# Patient Record
Sex: Female | Born: 1955 | Race: Black or African American | Hispanic: No | Marital: Married | Smoking: Never smoker
Health system: Southern US, Community
[De-identification: ages and names within clinical notes are randomized; demographics above are authoritative.]

## PROBLEM LIST (undated history)

## (undated) DIAGNOSIS — IMO0001 Reserved for inherently not codable concepts without codable children: Secondary | ICD-10-CM

## (undated) DIAGNOSIS — F5105 Insomnia due to other mental disorder: Secondary | ICD-10-CM

## (undated) DIAGNOSIS — Z8659 Personal history of other mental and behavioral disorders: Secondary | ICD-10-CM

## (undated) DIAGNOSIS — K219 Gastro-esophageal reflux disease without esophagitis: Secondary | ICD-10-CM

## (undated) DIAGNOSIS — D869 Sarcoidosis, unspecified: Secondary | ICD-10-CM

## (undated) DIAGNOSIS — F419 Anxiety disorder, unspecified: Secondary | ICD-10-CM

## (undated) DIAGNOSIS — G4730 Sleep apnea, unspecified: Secondary | ICD-10-CM

## (undated) HISTORY — DX: Anxiety disorder, unspecified: F51.05

## (undated) HISTORY — DX: Anxiety disorder, unspecified: F41.9

---

## 2004-10-17 ENCOUNTER — Ambulatory Visit: Payer: Self-pay

## 2004-11-09 ENCOUNTER — Ambulatory Visit: Payer: Self-pay | Admitting: Unknown Physician Specialty

## 2005-10-16 ENCOUNTER — Ambulatory Visit: Payer: Self-pay

## 2006-11-10 ENCOUNTER — Emergency Department: Payer: Self-pay | Admitting: Emergency Medicine

## 2006-11-18 ENCOUNTER — Emergency Department: Payer: Self-pay | Admitting: Emergency Medicine

## 2007-03-11 ENCOUNTER — Ambulatory Visit: Payer: Self-pay

## 2007-03-30 ENCOUNTER — Other Ambulatory Visit: Payer: Self-pay

## 2007-03-30 ENCOUNTER — Emergency Department: Payer: Self-pay | Admitting: Emergency Medicine

## 2007-04-06 ENCOUNTER — Ambulatory Visit: Payer: Self-pay | Admitting: Internal Medicine

## 2008-04-27 ENCOUNTER — Ambulatory Visit: Payer: Self-pay

## 2009-06-12 ENCOUNTER — Ambulatory Visit: Payer: Self-pay | Admitting: Internal Medicine

## 2011-01-08 ENCOUNTER — Ambulatory Visit: Payer: Self-pay

## 2011-04-15 ENCOUNTER — Ambulatory Visit: Payer: Self-pay | Admitting: Internal Medicine

## 2012-01-22 ENCOUNTER — Ambulatory Visit: Payer: Self-pay | Admitting: Family Medicine

## 2012-07-10 ENCOUNTER — Emergency Department: Payer: Self-pay | Admitting: Emergency Medicine

## 2013-01-26 ENCOUNTER — Ambulatory Visit: Payer: Self-pay | Admitting: Internal Medicine

## 2014-02-15 ENCOUNTER — Ambulatory Visit: Payer: Self-pay | Admitting: Internal Medicine

## 2014-10-17 ENCOUNTER — Other Ambulatory Visit: Payer: Self-pay | Admitting: Internal Medicine

## 2014-10-17 ENCOUNTER — Ambulatory Visit
Admission: RE | Admit: 2014-10-17 | Discharge: 2014-10-17 | Disposition: A | Discharge status: Home / Self Care | Payer: BLUE CROSS/BLUE SHIELD | Source: Ambulatory Visit | Attending: Cardiology | Admitting: Cardiology

## 2014-10-17 ENCOUNTER — Ambulatory Visit
Admission: RE | Admit: 2014-10-17 | Discharge: 2014-10-17 | Disposition: A | Discharge status: Home / Self Care | Payer: BLUE CROSS/BLUE SHIELD | Source: Ambulatory Visit | Attending: Internal Medicine | Admitting: Internal Medicine

## 2014-10-17 DIAGNOSIS — R079 Chest pain, unspecified: Secondary | ICD-10-CM | POA: Diagnosis present

## 2014-10-17 DIAGNOSIS — R911 Solitary pulmonary nodule: Secondary | ICD-10-CM | POA: Insufficient documentation

## 2014-11-14 ENCOUNTER — Other Ambulatory Visit: Payer: Self-pay | Admitting: Internal Medicine

## 2014-11-14 DIAGNOSIS — R0789 Other chest pain: Secondary | ICD-10-CM

## 2014-11-14 DIAGNOSIS — R918 Other nonspecific abnormal finding of lung field: Secondary | ICD-10-CM

## 2014-11-18 ENCOUNTER — Ambulatory Visit
Admission: RE | Admit: 2014-11-18 | Discharge: 2014-11-18 | Disposition: A | Discharge status: Home / Self Care | Payer: BLUE CROSS/BLUE SHIELD | Source: Ambulatory Visit | Attending: Internal Medicine | Admitting: Internal Medicine

## 2014-11-18 DIAGNOSIS — R0789 Other chest pain: Secondary | ICD-10-CM

## 2014-11-18 DIAGNOSIS — R918 Other nonspecific abnormal finding of lung field: Secondary | ICD-10-CM | POA: Diagnosis present

## 2014-11-18 DIAGNOSIS — R590 Localized enlarged lymph nodes: Secondary | ICD-10-CM | POA: Insufficient documentation

## 2014-11-18 MED ORDER — IOHEXOL 300 MG/ML  SOLN
75.0000 mL | Freq: Once | INTRAMUSCULAR | Status: AC | PRN
  Administered 2014-11-18: 75 mL via INTRAVENOUS

## 2014-12-15 ENCOUNTER — Encounter
Admission: RE | Admit: 2014-12-15 | Discharge: 2014-12-15 | Disposition: A | Discharge status: Home / Self Care | Payer: BLUE CROSS/BLUE SHIELD | Source: Ambulatory Visit | Attending: Internal Medicine | Admitting: Internal Medicine

## 2014-12-15 DIAGNOSIS — D860 Sarcoidosis of lung: Secondary | ICD-10-CM | POA: Diagnosis not present

## 2014-12-15 DIAGNOSIS — Z01818 Encounter for other preprocedural examination: Secondary | ICD-10-CM | POA: Diagnosis present

## 2014-12-15 HISTORY — DX: Sleep apnea, unspecified: G47.30

## 2014-12-15 HISTORY — DX: Reserved for inherently not codable concepts without codable children: IMO0001

## 2014-12-15 HISTORY — DX: Anxiety disorder, unspecified: F41.9

## 2014-12-15 HISTORY — DX: Personal history of other mental and behavioral disorders: Z86.59

## 2014-12-15 HISTORY — DX: Gastro-esophageal reflux disease without esophagitis: K21.9

## 2014-12-15 LAB — CBC
HEMATOCRIT: 37.4 % (ref 35.0–47.0)
Hemoglobin: 12.4 g/dL (ref 12.0–16.0)
MCH: 28.7 pg (ref 26.0–34.0)
MCHC: 33.3 g/dL (ref 32.0–36.0)
MCV: 86.4 fL (ref 80.0–100.0)
Platelets: 189 10*3/uL (ref 150–440)
RBC: 4.32 MIL/uL (ref 3.80–5.20)
RDW: 13.4 % (ref 11.5–14.5)
WBC: 5.2 10*3/uL (ref 3.6–11.0)

## 2014-12-15 LAB — DIFFERENTIAL
BASOS PCT: 1 %
Basophils Absolute: 0 10*3/uL (ref 0–0.1)
EOS ABS: 0.2 10*3/uL (ref 0–0.7)
Eosinophils Relative: 3 %
Lymphocytes Relative: 38 %
Lymphs Abs: 2 10*3/uL (ref 1.0–3.6)
MONO ABS: 0.7 10*3/uL (ref 0.2–0.9)
MONOS PCT: 14 %
Neutro Abs: 2.3 10*3/uL (ref 1.4–6.5)
Neutrophils Relative %: 44 %

## 2014-12-15 LAB — BASIC METABOLIC PANEL
Anion gap: 7 (ref 5–15)
BUN: 21 mg/dL — AB (ref 6–20)
CALCIUM: 9.1 mg/dL (ref 8.9–10.3)
CO2: 28 mmol/L (ref 22–32)
Chloride: 103 mmol/L (ref 101–111)
Creatinine, Ser: 0.93 mg/dL (ref 0.44–1.00)
GFR calc Af Amer: >60 mL/min (ref >60)
GLUCOSE: 98 mg/dL (ref 65–99)
Potassium: 3.8 mmol/L (ref 3.5–5.1)
Sodium: 138 mmol/L (ref 135–145)

## 2014-12-15 NOTE — Patient Instructions (Signed)
  Your procedure is scheduled on: December 21, 2014(Wednesday) Report to Day Surgery.[facility]) To find out your arrival time please call [phone] between 1PM - 3PM on December 20, 2014(Tuesday).  Remember: Instructions that are not followed completely may result in serious medical risk, up to and including death, or upon the discretion of your surgeon and anesthesiologist your surgery may need to be rescheduled.    __x__ 1. Do not eat food or drink liquids after midnight. No gum chewing or hard candies.     ____ 2. No Alcohol for 24 hours before or after surgery.   ____ 3. Bring all medications with you on the day of surgery if instructed.    __x__ 4. Notify your doctor if there is any change in your medical condition     (cold, fever, infections).     Do not wear jewelry, make-up, hairpins, clips or nail polish.  Do not wear lotions, powders, or perfumes. You may wear deodorant.  Do not shave 48 hours prior to surgery. Men may shave face and neck.  Do not bring valuables to the hospital.    [facility] is not responsible for any belongings or valuables.               Contacts, dentures or bridgework may not be worn into surgery.  Leave your suitcase in the car. After surgery it may be brought to your room.  For patients admitted to the hospital, discharge time is determined by your                treatment team.   Patients discharged the day of surgery will not be allowed to drive home.   Please read over the following fact sheets that you were given:      ____ Take these medicines the morning of surgery with A SIP OF WATER:    1.   2.   3.   4.  5.  6.  ____ Fleet Enema (as directed)   ____ Use CHG Soap as directed  __x__ Use inhalers on the day of surgery(Use Albuterol inhaler the day of surgery and bring inhaler with you to hospital)  ____ Stop metformin 2 days prior to surgery    ____ Take 1/2 of usual insulin dose the night before surgery and none on the  morning of surgery.   __x__ Stop Coumadin/Plavix/aspirin on (Patient stopped Aspirin one week ago)  ____ Stop Anti-inflammatories on    ____ Stop supplements until after surgery.    __x__ Bring C-Pap to the hospital.

## 2014-12-21 ENCOUNTER — Ambulatory Visit
Admission: RE | Admit: 2014-12-21 | Payer: BLUE CROSS/BLUE SHIELD | Source: Ambulatory Visit | Admitting: Internal Medicine

## 2014-12-21 ENCOUNTER — Encounter: Admission: RE | Payer: Self-pay | Source: Ambulatory Visit

## 2014-12-21 ENCOUNTER — Ambulatory Visit
Admission: RE | Admit: 2014-12-21 | Discharge: 2014-12-21 | Disposition: A | Discharge status: Home / Self Care | Payer: BLUE CROSS/BLUE SHIELD | Source: Ambulatory Visit | Attending: Internal Medicine | Admitting: Internal Medicine

## 2014-12-21 ENCOUNTER — Encounter: Payer: Self-pay | Admitting: *Deleted

## 2014-12-21 ENCOUNTER — Encounter
Admission: RE | Disposition: A | Discharge status: Home / Self Care | Payer: Self-pay | Source: Ambulatory Visit | Attending: Internal Medicine

## 2014-12-21 ENCOUNTER — Ambulatory Visit: Payer: BLUE CROSS/BLUE SHIELD | Admitting: Certified Registered Nurse Anesthetist

## 2014-12-21 DIAGNOSIS — Z7951 Long term (current) use of inhaled steroids: Secondary | ICD-10-CM | POA: Diagnosis not present

## 2014-12-21 DIAGNOSIS — R0602 Shortness of breath: Secondary | ICD-10-CM | POA: Insufficient documentation

## 2014-12-21 DIAGNOSIS — R079 Chest pain, unspecified: Secondary | ICD-10-CM | POA: Diagnosis present

## 2014-12-21 DIAGNOSIS — E669 Obesity, unspecified: Secondary | ICD-10-CM | POA: Insufficient documentation

## 2014-12-21 DIAGNOSIS — R590 Localized enlarged lymph nodes: Secondary | ICD-10-CM | POA: Diagnosis not present

## 2014-12-21 DIAGNOSIS — G4730 Sleep apnea, unspecified: Secondary | ICD-10-CM | POA: Diagnosis not present

## 2014-12-21 DIAGNOSIS — R942 Abnormal results of pulmonary function studies: Secondary | ICD-10-CM | POA: Diagnosis present

## 2014-12-21 DIAGNOSIS — Z6836 Body mass index (BMI) 36.0-36.9, adult: Secondary | ICD-10-CM | POA: Insufficient documentation

## 2014-12-21 DIAGNOSIS — R599 Enlarged lymph nodes, unspecified: Secondary | ICD-10-CM

## 2014-12-21 DIAGNOSIS — K219 Gastro-esophageal reflux disease without esophagitis: Secondary | ICD-10-CM | POA: Diagnosis not present

## 2014-12-21 DIAGNOSIS — Z79899 Other long term (current) drug therapy: Secondary | ICD-10-CM | POA: Diagnosis not present

## 2014-12-21 HISTORY — PX: ENDOBRONCHIAL ULTRASOUND: SHX5096

## 2014-12-21 SURGERY — ENDOBRONCHIAL ULTRASOUND (EBUS)
Anesthesia: Choice

## 2014-12-21 SURGERY — ENDOBRONCHIAL ULTRASOUND (EBUS)
Anesthesia: General

## 2014-12-21 MED ORDER — PROPOFOL 10 MG/ML IV BOLUS
INTRAVENOUS | Status: DC | PRN
  Administered 2014-12-21: 30 mg via INTRAVENOUS
  Administered 2014-12-21: 200 mg via INTRAVENOUS

## 2014-12-21 MED ORDER — MIDAZOLAM HCL 2 MG/2ML IJ SOLN
INTRAMUSCULAR | Status: DC | PRN
Start: 2014-12-21 — End: 2014-12-21
  Administered 2014-12-21: 2 mg via INTRAVENOUS

## 2014-12-21 MED ORDER — LACTATED RINGERS IV SOLN
INTRAVENOUS | Status: DC
  Administered 2014-12-21 (×2): via INTRAVENOUS

## 2014-12-21 MED ORDER — ONDANSETRON HCL 4 MG/2ML IJ SOLN
INTRAMUSCULAR | Status: DC | PRN
  Administered 2014-12-21: 4 mg via INTRAVENOUS

## 2014-12-21 MED ORDER — DEXAMETHASONE SODIUM PHOSPHATE 4 MG/ML IJ SOLN
INTRAMUSCULAR | Status: DC | PRN
  Administered 2014-12-21: 10 mg via INTRAVENOUS

## 2014-12-21 MED ORDER — ROCURONIUM BROMIDE 100 MG/10ML IV SOLN
INTRAVENOUS | Status: DC | PRN
  Administered 2014-12-21: 5 mg via INTRAVENOUS

## 2014-12-21 MED ORDER — SUCCINYLCHOLINE CHLORIDE 20 MG/ML IJ SOLN
INTRAMUSCULAR | Status: DC | PRN
  Administered 2014-12-21: 140 mg via INTRAVENOUS

## 2014-12-21 MED ORDER — FENTANYL CITRATE (PF) 100 MCG/2ML IJ SOLN: 25.0000 ug | INTRAMUSCULAR | Status: DC | PRN

## 2014-12-21 MED ORDER — FENTANYL CITRATE (PF) 100 MCG/2ML IJ SOLN
INTRAMUSCULAR | Status: DC | PRN
  Administered 2014-12-21: 100 ug via INTRAVENOUS

## 2014-12-21 MED ORDER — LIDOCAINE HCL 2 % EX GEL: 1.0000 "application " | Freq: Once | CUTANEOUS | Status: DC

## 2014-12-21 MED ORDER — LIDOCAINE HCL (CARDIAC) 20 MG/ML IV SOLN
INTRAVENOUS | Status: DC | PRN
Start: 2014-12-21 — End: 2014-12-21
  Administered 2014-12-21: 100 mg via INTRAVENOUS

## 2014-12-21 MED ORDER — ONDANSETRON HCL 4 MG/2ML IJ SOLN: 4.0000 mg | Freq: Once | INTRAMUSCULAR | Status: DC | PRN

## 2014-12-21 NOTE — Anesthesia Preprocedure Evaluation (Addendum)
Anesthesia Evaluation  Patient identified by MRN, date of birth, ID band Patient awake    Reviewed: Allergy & Precautions, NPO status , Patient's Chart, lab work & pertinent test results  Airway Mallampati: II  TM Distance: >3 FB Neck ROM: Full    Dental  (+) Teeth Intact   Pulmonary sleep apnea and Continuous Positive Airway Pressure Ventilation ,    Pulmonary exam normal        Cardiovascular Exercise Tolerance: Good negative cardio ROS Normal cardiovascular exam     Neuro/Psych    GI/Hepatic GERD  Medicated and Controlled,  Endo/Other    Renal/GU      Musculoskeletal   Abdominal (+) + obese,   Peds  Hematology   Anesthesia Other Findings   Reproductive/Obstetrics                            Anesthesia Physical Anesthesia Plan  ASA: III  Anesthesia Plan: General   Post-op Pain Management:    Induction: Intravenous  Airway Management Planned: Oral ETT  Additional Equipment:   Intra-op Plan:   Post-operative Plan: Extubation in OR  Informed Consent: I have reviewed the patients History and Physical, chart, labs and discussed the procedure including the risks, benefits and alternatives for the proposed anesthesia with the patient or authorized representative who has indicated his/her understanding and acceptance.     Plan Discussed with: CRNA  Anesthesia Plan Comments:         Anesthesia Quick Evaluation

## 2014-12-21 NOTE — Transfer of Care (Signed)
Immediate Anesthesia Transfer of Care Note  Patient: Valerie Yu  Procedure(s) Performed: Procedure(s): ENDOBRONCHIAL ULTRASOUND (N/A)  Patient Location: PACU  Anesthesia Type:General  Level of Consciousness: awake and alert   Airway & Oxygen Therapy: Patient Spontanous Breathing and Patient connected to face mask oxygen  Post-op Assessment: Report given to RN  Post vital signs: Reviewed and stable  Last Vitals:  Filed Vitals:   12/21/14 1208  BP: 139/74  Pulse: 81  Temp: 36.7 C  Resp: 18    Complications: No apparent anesthesia complications

## 2014-12-21 NOTE — Anesthesia Postprocedure Evaluation (Signed)
  Anesthesia Post-op Note  Patient: Valerie Yu  Procedure(s) Performed: Procedure(s): ENDOBRONCHIAL ULTRASOUND (N/A)  Anesthesia type:General  Patient location: PACU  Post pain: Pain level controlled  Post assessment: Post-op Vital signs reviewed, Patient's Cardiovascular Status Stable, Respiratory Function Stable, Patent Airway and No signs of Nausea or vomiting  Post vital signs: Reviewed and stable  Last Vitals:  Filed Vitals:   12/21/14 1208  BP: 139/74  Pulse: 81  Temp: 36.7 C  Resp: 18    Level of consciousness: awake, alert  and patient cooperative  Complications: No apparent anesthesia complications

## 2014-12-21 NOTE — Op Note (Signed)
[facility] Patient Name: Valerie DegreeJerome Messimer Procedure Date: 12/21/2014 11:26 AM MRN: [id number] Account #: [id number] Date of Birth: 07/17/1955 Admit Type: Outpatient Age: [age] Room: Bronch Suite on 2nd floor Gender: Female Note Status: Finalized Attending MD: Santiago Gladavid Horice Carrero, MD Procedure:         Bronchoscopy/EBUS Indications:       Mediastinal adenopathy, Paratracheal adenopathy Providers:         Santiago Gladavid Pearson Picou, MD, [place] Chardammy Bailey, Admin. Radiographer, therapeutic(Technician) Referring MD:       Medicines:         Monitored Anesthesia Care Complications:     No immediate complications Procedure:         Pre-Anesthesia Assessment:                    - The risks and benefits of the procedure and the sedation                     options and risks were discussed with the patient. All                     questions were answered and informed consent was obtained.                    - A History and Physical has been performed. The patient's                     medications, allergies and sensitivities have been                     reviewed.                    After obtaining informed consent, the bronchoscope was                     passed under direct vision. Throughout the procedure, the                     patient's blood pressure, pulse, and oxygen saturations                     were monitored continuously. the Bronchoscope was                     introduced through the mouth, via the endotracheal tube                     and advanced to the trachea. The procedure was                     accomplished without difficulty. The patient tolerated the                     procedure well. Findings:      Lymph Nodes: Transbronchial biopsies were performed of the level 4R       (lower paratracheal) lymph node(s) of the lung using a 21 gauge Wang       needle and sent for routine cytology. The procedure was guided by       ultrasound. Six biopsy passes were performed. Six biopsy samples were        obtained. Impression:        - Mediastinal adenopathy                    -  Paratracheal adenopathy                    - Transbronchial lung biopsies were performed. Recommendation:    - Await biopsy results. Santiago Glad MD Santiago Glad, MD 12/21/2014 11:56:04 AM This report has been signed electronically. Number of Addenda: 0 Note Initiated On: 12/21/2014 11:26 AM      [facility]

## 2014-12-21 NOTE — Anesthesia Procedure Notes (Signed)
Procedure Name: Intubation Date/Time: 12/21/2014 11:17 AM Performed by: Ginger CarneMICHELET, Dallis Darden Pre-anesthesia Checklist: Patient identified, Emergency Drugs available, Suction available, Patient being monitored and Timeout performed Patient Re-evaluated:Patient Re-evaluated prior to inductionOxygen Delivery Method: Circle system utilized Preoxygenation: Pre-oxygenation with 100% oxygen Intubation Type: IV induction Ventilation: Mask ventilation without difficulty Laryngoscope Size: Miller and 2 Grade View: Grade II Tube type: Oral Tube size: 8.5 mm Number of attempts: 1 Placement Confirmation: ETT inserted through vocal cords under direct vision,  breath sounds checked- equal and bilateral,  positive ETCO2 and CO2 detector Secured at: 22 cm Tube secured with: Tape Dental Injury: Teeth and Oropharynx as per pre-operative assessment

## 2014-12-21 NOTE — Discharge Instructions (Signed)
Flexible Bronchoscopy, Care After °Refer to this sheet in the next few weeks. These instructions provide you with information on caring for yourself after your procedure. Your health care provider may also give you more specific instructions. Your treatment has been planned according to current medical practices, but problems sometimes occur. Call your health care provider if you have any problems or questions after your procedure.  °WHAT TO EXPECT AFTER THE PROCEDURE °It is normal to have the following symptoms for 24-48 hours after the procedure:  °· Increased cough. °· Low-grade fever. °· Sore throat or hoarse voice. °· Small streaks of blood in your thick spit (sputum) if tissue samples were taken (biopsy). °HOME CARE INSTRUCTIONS  °· Do not eat or drink anything for 2 hours after your procedure. Your nose and throat were numbed by medicine. If you try to eat or drink before the medicine wears off, food or drink could go into your lungs or you could burn yourself. After the numbness is gone and your cough and gag reflexes have returned, you may eat soft food and drink liquids slowly.   °· The day after the procedure, you can go back to your normal diet.   °· You may resume normal activities.   °· Keep all follow-up visits as directed by your health care provider. It is important to keep all your appointments, especially if tissue samples were taken for testing (biopsy). °SEEK IMMEDIATE MEDICAL CARE IF:  °· You have increasing shortness of breath.   °· You become light-headed or faint.   °· You have chest pain.   °· You have any new concerning symptoms. °· You cough up more than a small amount of blood. °· The amount of blood you cough up increases. °MAKE SURE YOU: °· Understand these instructions. °· Will watch your condition. °· Will get help right away if you are not doing well or get worse. °  °This information is not intended to replace advice given to you by your health care provider. Make sure you discuss  any questions you have with your health care provider. °  °Document Released: 08/31/2004 Document Revised: 03/04/2014 Document Reviewed: 10/16/2012 °Elsevier Interactive Patient Education ©2016 [employer] ° °

## 2014-12-21 NOTE — H&P (Signed)
.   [facility]     Date: 12/21/2014,   MRN# [id number] Azzie AlmasJerome R Timko 06/07/1955 Code Status:  Hosp day:@LENGTHOFSTAYDAYS @ Referring MD: @ATDPROV @     PCP:      AdmissionWeight: 218 lb (98.884 kg)                 CurrentWeight: 218 lb (98.884 kg) Azzie AlmasJerome R Houser is a 59 y.o. old female seen in consultation for abnormal Ct chest  CC SOB HPI 59 yo AAF seen today for EBUS Patient with abnormal CT chest c/w mediastinal adenopathy Had some chest pain and SOB and DOE for several months Had extensive cardiac work up.         MEDICATIONS    Home Medication:  No current outpatient prescriptions on file.  Current Medication:   Current facility-administered medications:  .  lactated ringers infusion, , Intravenous, Continuous, Rosaria FerriesJoseph K Piscitello, MD, Last Rate: 75 mL/hr at 12/21/14 1049 .  lidocaine (XYLOCAINE) 2 % jelly 1 application, 1 application, Topical, Once, Erin FullingKurian Farouk Vivero, MD     ALLERGIES   Citalopram     REVIEW OF SYSTEMS   Review of Systems  Constitutional: Negative for fever, chills and weight loss.  HENT: Negative.   Eyes: Negative.   Respiratory: Positive for shortness of breath. Negative for wheezing.   Cardiovascular: Positive for chest pain.  Gastrointestinal: Negative for nausea, vomiting and abdominal pain.  Genitourinary: Negative.   Musculoskeletal: Positive for back pain.  Skin: Negative for rash.  Neurological: Negative for dizziness, tingling and tremors.  Endo/Heme/Allergies: Negative.   Psychiatric/Behavioral: Negative.   All other systems reviewed and are negative.    VS: BP 153/89 mmHg  Pulse 85  Temp(Src) 96.7 F (35.9 C) (Tympanic)  Resp 16  Ht 5\' 5"  (1.651 m)  Wt 218 lb (98.884 kg)  BMI 36.28 kg/m2  SpO2 100%     PHYSICAL EXAM   Physical Exam  Constitutional: She is oriented to person, place, and time. She appears well-developed and well-nourished. No distress.  HENT:  Head:  Normocephalic and atraumatic.  Mouth/Throat: No oropharyngeal exudate.  Eyes: EOM are normal. Pupils are equal, round, and reactive to light.  Neck: Normal range of motion. Neck supple.  Cardiovascular: Normal rate, regular rhythm and normal heart sounds.   No murmur heard. Pulmonary/Chest: No respiratory distress. She has no wheezes.  Abdominal: Soft. Bowel sounds are normal.  Musculoskeletal: Normal range of motion. She exhibits no edema.  Neurological: She is alert and oriented to person, place, and time.  Skin: Skin is warm. She is not diaphoretic.  Psychiatric: She has a normal mood and affect.       IMAGING    CT chest with Mediastinal Adenopathy    ASSESSMENT/PLAN   Sarcoid vs Lymphoma  Plan for EBUS today   The Risks and Benefits of the Bronchoscopy with EBUS were explained to patient/family and I have discussed the risk for acute bleeding, increased chance of infection, increased chance of respiratory failure and cardiac arrest and death. The patient/family understand the risks and benefits and have agreed to proceed with procedure.    Patient/Family are satisfied with Plan of action and management. All questions answered   Lucie LeatherKurian David Tashi Band, M.D.  [facility]  Medical Director [facility] Medical Director [facility] Card[facility]

## 2014-12-21 NOTE — Op Note (Signed)
PROCEDURE: ENDOBRONCHIAL ULTRASOUND   PROCEDURE DATE: 12/21/2014  TIME:  NAME:  Valerie Yu  DOB:09/06/1955  MRN: [id number] LOC:  ARPO/None    HOSP DAY: @LENGTHOFSTAYDAYS @ CODE STATUS:        Indications/Preliminary Diagnosis:   Consent: (Place X beside choice/s below)  The benefits, risks and possible complications of the procedure were        explained to:  _x__ patient  _x__ patient's family  ___ other:___________  who verbalized understanding and gave:  ___ verbal  ___ written  __x_ verbal and written  ___ telephone  ___ other:________ consent.      Unable to obtain consent; procedure performed on emergent basis.     Other:       PRESEDATION ASSESSMENT: History and Physical has been performed. Patient meds and allergies have been reviewed. Presedation airway examination has been performed and documented. Baseline vital signs, sedation score, oxygenation status, and cardiac rhythm were reviewed. Patient was deemed to be in satisfactory condition to undergo the procedure.    PREMEDICATIONS: SEE ANESTHESIOLOGY RECORDS   Airway Prep (Place X beside choice below)   1% Transtracheal Lidocaine Anesthetization 7 cc   Patient prepped per Bronchoscopy Lab Policy       Insertion Route (Place X beside choice below)   Nasal   Oral   Endotracheal Tube   Tracheostomy   INTRAPROCEDURE MEDICATIONS: SEE ANESTHESIOLOGY RECORDS   PROCEDURE DETAILS: Timeout performed and correct patient, name, & ID confirmed. Following prep per Pulmonary policy, appropriate sedation was administered.  Airway exam proceeded with findings, technical procedures, and specimen collection as noted below. At the end of exam the scope was withdrawn without incident. Impression and Plan as noted below.   EBUS scope was transversed to Paratracheal space, I was able to visualize enlarged LN's approx 3x2 Cm in lower paratracheal space    TECHNICAL PROCEDURES: (Place X beside choice below)   Procedures   Description    None     Electrocautery     Cryotherapy     Balloon Dilatation     Bronchography     Stent Placement     Therapeutic Aspiration     Laser/Argon Plasma    Brachytherapy Catheter Placement    Foreign Body Removal     SPECIMENS (Sites): (Place X beside choice below)  Specimens Description   No Specimens Obtained     Washings    Lavage   x Biopsies FNA 21 Gauge x 6   Fine Needle Aspirates    Brushings    Sputum    FINDINGS: adenopathy ESTIMATED BLOOD LOSS: none COMPLICATIONS/RESOLUTION: none       IMPRESSION:POST-PROCEDURE DX: likely sarcoid -follow up pathology reports    RECOMMENDATION/PLAN: follow up pathology     Lucie LeatherKurian David Nashea Chumney, M.D.  [facility]  Medical Director [facility] Medical Director [facility] Card[facility]

## 2014-12-24 LAB — CYTOLOGY - NON PAP

## 2015-01-10 ENCOUNTER — Other Ambulatory Visit: Payer: Self-pay | Admitting: Family Medicine

## 2015-01-10 DIAGNOSIS — Z1231 Encounter for screening mammogram for malignant neoplasm of breast: Secondary | ICD-10-CM

## 2015-02-23 ENCOUNTER — Ambulatory Visit
Admission: RE | Admit: 2015-02-23 | Discharge: 2015-02-23 | Disposition: A | Discharge status: Home / Self Care | Payer: BLUE CROSS/BLUE SHIELD | Source: Ambulatory Visit | Attending: Family Medicine | Admitting: Family Medicine

## 2015-02-23 DIAGNOSIS — Z1231 Encounter for screening mammogram for malignant neoplasm of breast: Secondary | ICD-10-CM | POA: Insufficient documentation

## 2015-03-10 ENCOUNTER — Ambulatory Visit
Admission: RE | Admit: 2015-03-10 | Discharge: 2015-03-10 | Disposition: A | Discharge status: Home / Self Care | Payer: BLUE CROSS/BLUE SHIELD | Source: Ambulatory Visit | Attending: Internal Medicine | Admitting: Internal Medicine

## 2015-03-10 ENCOUNTER — Other Ambulatory Visit: Payer: Self-pay | Admitting: Internal Medicine

## 2015-03-10 DIAGNOSIS — R059 Cough, unspecified: Secondary | ICD-10-CM

## 2015-03-10 DIAGNOSIS — R05 Cough: Secondary | ICD-10-CM | POA: Diagnosis present

## 2015-03-10 DIAGNOSIS — D869 Sarcoidosis, unspecified: Secondary | ICD-10-CM | POA: Diagnosis not present

## 2015-03-10 DIAGNOSIS — R0781 Pleurodynia: Secondary | ICD-10-CM | POA: Diagnosis present

## 2015-03-10 DIAGNOSIS — R079 Chest pain, unspecified: Secondary | ICD-10-CM

## 2015-04-21 ENCOUNTER — Other Ambulatory Visit: Payer: Self-pay | Admitting: Internal Medicine

## 2015-04-21 DIAGNOSIS — R079 Chest pain, unspecified: Secondary | ICD-10-CM

## 2015-04-26 ENCOUNTER — Ambulatory Visit
Admission: RE | Admit: 2015-04-26 | Discharge: 2015-04-26 | Disposition: A | Discharge status: Home / Self Care | Payer: BLUE CROSS/BLUE SHIELD | Source: Ambulatory Visit | Attending: Internal Medicine | Admitting: Internal Medicine

## 2015-04-26 DIAGNOSIS — D869 Sarcoidosis, unspecified: Secondary | ICD-10-CM | POA: Diagnosis not present

## 2015-04-26 DIAGNOSIS — R918 Other nonspecific abnormal finding of lung field: Secondary | ICD-10-CM | POA: Diagnosis not present

## 2015-04-26 DIAGNOSIS — R079 Chest pain, unspecified: Secondary | ICD-10-CM | POA: Diagnosis present

## 2015-04-26 MED ORDER — IOHEXOL 350 MG/ML SOLN
75.0000 mL | Freq: Once | INTRAVENOUS | Status: AC | PRN
  Administered 2015-04-26: 75 mL via INTRAVENOUS

## 2015-07-12 ENCOUNTER — Emergency Department
Admission: EM | Admit: 2015-07-12 | Discharge: 2015-07-12 | Disposition: A | Discharge status: Home / Self Care | Payer: BLUE CROSS/BLUE SHIELD | Attending: Emergency Medicine | Admitting: Emergency Medicine

## 2015-07-12 ENCOUNTER — Emergency Department: Payer: BLUE CROSS/BLUE SHIELD

## 2015-07-12 ENCOUNTER — Encounter: Payer: Self-pay | Admitting: Emergency Medicine

## 2015-07-12 DIAGNOSIS — R079 Chest pain, unspecified: Secondary | ICD-10-CM

## 2015-07-12 DIAGNOSIS — Z79899 Other long term (current) drug therapy: Secondary | ICD-10-CM | POA: Insufficient documentation

## 2015-07-12 DIAGNOSIS — Z7982 Long term (current) use of aspirin: Secondary | ICD-10-CM | POA: Insufficient documentation

## 2015-07-12 DIAGNOSIS — D860 Sarcoidosis of lung: Secondary | ICD-10-CM | POA: Diagnosis not present

## 2015-07-12 DIAGNOSIS — R0789 Other chest pain: Secondary | ICD-10-CM | POA: Diagnosis not present

## 2015-07-12 DIAGNOSIS — D869 Sarcoidosis, unspecified: Secondary | ICD-10-CM | POA: Insufficient documentation

## 2015-07-12 HISTORY — DX: Sarcoidosis, unspecified: D86.9

## 2015-07-12 LAB — COMPREHENSIVE METABOLIC PANEL
ALK PHOS: 41 U/L (ref 38–126)
ALT: 22 U/L (ref 14–54)
AST: 32 U/L (ref 15–41)
Albumin: 3.5 g/dL (ref 3.5–5.0)
Anion gap: 7 (ref 5–15)
BUN: 19 mg/dL (ref 6–20)
CALCIUM: 9 mg/dL (ref 8.9–10.3)
CO2: 26 mmol/L (ref 22–32)
CREATININE: 1.06 mg/dL — AB (ref 0.44–1.00)
Chloride: 106 mmol/L (ref 101–111)
GFR calc non Af Amer: 57 mL/min — ABNORMAL LOW (ref >60)
GLUCOSE: 150 mg/dL — AB (ref 65–99)
Potassium: 3.8 mmol/L (ref 3.5–5.1)
SODIUM: 139 mmol/L (ref 135–145)
Total Bilirubin: 0.5 mg/dL (ref 0.3–1.2)
Total Protein: 6.8 g/dL (ref 6.5–8.1)

## 2015-07-12 LAB — CBC WITH DIFFERENTIAL/PLATELET
Basophils Absolute: 0 10*3/uL (ref 0–0.1)
Basophils Relative: 0 %
EOS ABS: 0.1 10*3/uL (ref 0–0.7)
Eosinophils Relative: 3 %
HCT: 36.4 % (ref 35.0–47.0)
HEMOGLOBIN: 12.4 g/dL (ref 12.0–16.0)
LYMPHS ABS: 2.1 10*3/uL (ref 1.0–3.6)
LYMPHS PCT: 38 %
MCH: 29.9 pg (ref 26.0–34.0)
MCHC: 34.2 g/dL (ref 32.0–36.0)
MCV: 87.3 fL (ref 80.0–100.0)
Monocytes Absolute: 0.5 10*3/uL (ref 0.2–0.9)
Monocytes Relative: 10 %
NEUTROS ABS: 2.8 10*3/uL (ref 1.4–6.5)
NEUTROS PCT: 49 %
Platelets: 193 10*3/uL (ref 150–440)
RBC: 4.17 MIL/uL (ref 3.80–5.20)
RDW: 13.2 % (ref 11.5–14.5)
WBC: 5.7 10*3/uL (ref 3.6–11.0)

## 2015-07-12 LAB — TROPONIN I
Troponin I: <0.03 ng/mL
Troponin I: <0.03 ng/mL (ref <0.031)

## 2015-07-12 MED ORDER — KETOROLAC TROMETHAMINE 30 MG/ML IJ SOLN
15.0000 mg | Freq: Once | INTRAMUSCULAR | Status: AC
  Administered 2015-07-12: 15 mg via INTRAVENOUS
  Filled 2015-07-12: qty 1

## 2015-07-12 NOTE — ED Notes (Signed)
Pt in via EMS with complaints of intermittent chest pain radiating to left arm and back that wakes her up every morning x 1 week.  Pt reports shortness of breath, nausea, weakness during episodes of chest pain.  Pt reports left arm tingling, lips tingling during episodes of chest pain as well.  Pt reports "this arm just doesn't feel right."  Pt reports hx of sarcoidosis.

## 2015-07-12 NOTE — ED Provider Notes (Signed)
Time Seen: Approximately 1205  I have reviewed the triage notes  Chief Complaint: Chest Pain   History of Present Illness: Valerie Yu is a 60 y.o. female who has a history of anxiety, panic attacks, and sarcoidosis. Patient's recently under treatment for the sarcoidosis with steroid medication. She states that she's had some intermittent chest pain radiating to the left side of her arm and back which is been going on intermittently now for the past 2 weeks to this historian. She states the pain today seemed to be more intense and she took some aspirin at home. She states she does get short of breath and feels some heart palpitations. She describes some nausea and some tingling around her mouth and in her left upper extremity. She initially stated weakness but then states that her arm "" just didn't feel right "". She denies any other neurologic symptoms at this time. She states that her pain seems to be worse with certain positions of sleep in or not activity related. She denies any fever or chills, vomiting.   Past Medical History  Diagnosis Date  . Sleep apnea   . Shortness of breath dyspnea   . Anxiety   . History of panic attacks   . GERD (gastroesophageal reflux disease)   . Sarcoidosis [facility])     Patient Active Problem List   Diagnosis Date Noted  . Sarcoidosis (HCC)   . Mediastinal adenopathy 12/21/2014    Past Surgical History  Procedure Laterality Date  . Endobronchial ultrasound N/A 12/21/2014    Procedure: ENDOBRONCHIAL ULTRASOUND;  Surgeon: Erin Fulling, MD;  Location: [facility] ORS;  Service: Cardiopulmonary;  Laterality: N/A;    Past Surgical History  Procedure Laterality Date  . Endobronchial ultrasound N/A 12/21/2014    Procedure: ENDOBRONCHIAL ULTRASOUND;  Surgeon: Erin Fulling, MD;  Location: [facility] ORS;  Service: Cardiopulmonary;  Laterality: N/A;    Current Outpatient Rx  Name  Route  Sig  Dispense  Refill  . albuterol (PROVENTIL HFA;VENTOLIN HFA) 108 (90  BASE) MCG/ACT inhaler   Inhalation   Inhale 2 puffs into the lungs every 6 (six) hours as needed for wheezing or shortness of breath.         . fluticasone (FLONASE) 50 MCG/ACT nasal spray   Each Nare   Place 2 sprays into both nostrils as needed for allergies or rhinitis.         Marland Kitchen meclizine (ANTIVERT) 12.5 MG tablet   Oral   Take 12.5 mg by mouth at bedtime.         . simvastatin (ZOCOR) 40 MG tablet   Oral   Take 40 mg by mouth daily at 6 PM.         . zolpidem (AMBIEN) 10 MG tablet   Oral   Take 10 mg by mouth at bedtime.           Allergies:  Citalopram  Family History: Family History  Problem Relation Age of Onset  . Diabetes Mother   . CVA Mother   . Hypertension Mother   . Breast cancer Neg Hx     Social History: Social History  Substance Use Topics  . Smoking status: Never Smoker   . Smokeless tobacco: Never Used  . Alcohol Use: No     Review of Systems:   10 point review of systems was performed and was otherwise negative:  Constitutional: No fever Eyes: No visual disturbances ENT: No sore throat, ear pain Cardiac: No chest pain Respiratory: No  shortness of breath, wheezing, or stridor Abdomen: No abdominal pain, no vomiting, No diarrhea Endocrine: No weight loss, No night sweats Extremities: No peripheral edema, cyanosis Skin: No rashes, easy bruising Neurologic: No focal weakness, trouble with speech or swollowing Urologic: No dysuria, Hematuria, or urinary frequency   Physical Exam:  ED Triage Vitals  Enc Vitals Group     BP 07/12/15 1205 144/76 mmHg     Pulse Rate 07/12/15 1205 84     Resp 07/12/15 1205 18     Temp 07/12/15 1205 98.3 F (36.8 C)     Temp Source 07/12/15 1205 Oral     SpO2 07/12/15 1156 97 %     Weight --      Height --      Head Cir --      Peak Flow --      Pain Score --      Pain Loc --      Pain Edu? --      Excl. in GC? --     General: Awake , Alert , and Oriented times 3; GCS 15.  Anxious Head: Normal cephalic , atraumatic Eyes: Pupils equal , round, reactive to light Nose/Throat: No nasal drainage, patent upper airway without erythema or exudate.  Neck: Supple, Full range of motion, No anterior adenopathy or palpable thyroid masses Lungs: Clear to ascultation without wheezes , rhonchi, or rales Heart: Regular rate, regular rhythm without murmurs , gallops , or rubs Abdomen: Soft, non tender without rebound, guarding , or rigidity; bowel sounds positive and symmetric in all 4 quadrants. No organomegaly .        Extremities: 2 plus symmetric pulses. No edema, clubbing or cyanosis Neurologic: normal ambulation, Motor symmetric without deficits, sensory intact Skin: warm, dry, no rashes Chest pain is reproduced with palpation across the left upper chest wall region without crepitus or step-off noted. Palpation here does reproduce the patient's discomfort.  Labs:   All laboratory work was reviewed including any pertinent negatives or positives listed below:  Labs Reviewed  CBC WITH DIFFERENTIAL/PLATELET  COMPREHENSIVE METABOLIC PANEL  TROPONIN I    EKG:  ED ECG REPORT I, Jennye MoccasinBrian S Quigley, the attending physician, personally viewed and interpreted this ECG.  Date: 07/12/2015 EKG Time: 1157 Rate: *81 Rhythm: normal sinus rhythm QRS Axis: normal Intervals: normal ST/T Wave abnormalities: normal Conduction Disturbances: none Narrative Interpretation: unremarkable Normal EKG   Radiology:   DG Chest 2 View (Final result) Result time: 07/12/15 13:03:49   Final result by Rad Results In Interface (07/12/15 13:03:49)   Narrative:   CLINICAL DATA: Chest pain  EXAM: CHEST 2 VIEW  COMPARISON: None.  FINDINGS: The heart size and mediastinal contours are within normal limits. Both lungs are clear. The visualized skeletal structures are unremarkable.  IMPRESSION: No active cardiopulmonary disease.   Electronically Signed By: Alcide CleverMark Lukens M.D.        I personally reviewed the radiologic studies    ED Course: * Patient's stay here was uneventful and she remained hemodynamically stable. Repeat exam shows no complaints of discomfort after 50 mg of IV Toradol. Differential includes all life-threatening causes for chest pain. This includes but is not exclusive to acute coronary syndrome, aortic dissection, pulmonary embolism, cardiac tamponade, community-acquired pneumonia, pericarditis, musculoskeletal chest wall pain, etc.  Given the patient's current clinical presentation and objective findings I felt most likely this was musculoskeletal chest wall pain. Patient had a CT done of the chest 2 months ago and I felt this  was unlikely to be pulmonary embolism, pneumonia, lung cancer, exacerbation of sarcoidosis, etc. She had serial troponins here and seems to have a musculoskeletal chest pain with an anxiety component. She was advised to take over-the-counter ibuprofen as needed.   Assessment: * Musculoskeletal chest wall pain Anxiety      Plan: * Outpatient Patient was advised to return immediately if condition worsens. Patient was advised to follow up with their primary care physician or other specialized physicians involved in their outpatient care. The patient and/or family member/power of attorney had laboratory results reviewed at the bedside. All questions and concerns were addressed and appropriate discharge instructions were distributed by the nursing staff.             Jennye Moccasin, MD 07/12/15 [phone]

## 2016-01-16 ENCOUNTER — Other Ambulatory Visit: Payer: Self-pay | Admitting: Family Medicine

## 2016-01-16 DIAGNOSIS — Z1231 Encounter for screening mammogram for malignant neoplasm of breast: Secondary | ICD-10-CM

## 2016-02-28 ENCOUNTER — Ambulatory Visit
Admission: RE | Admit: 2016-02-28 | Discharge: 2016-02-28 | Disposition: A | Discharge status: Home / Self Care | Payer: BLUE CROSS/BLUE SHIELD | Source: Ambulatory Visit | Attending: Family Medicine | Admitting: Family Medicine

## 2016-02-28 DIAGNOSIS — Z1231 Encounter for screening mammogram for malignant neoplasm of breast: Secondary | ICD-10-CM

## 2016-04-29 ENCOUNTER — Other Ambulatory Visit: Payer: Self-pay | Admitting: Internal Medicine

## 2016-04-29 DIAGNOSIS — D860 Sarcoidosis of lung: Secondary | ICD-10-CM

## 2016-05-24 ENCOUNTER — Ambulatory Visit
Admission: RE | Admit: 2016-05-24 | Discharge: 2016-05-24 | Disposition: A | Discharge status: Home / Self Care | Payer: BLUE CROSS/BLUE SHIELD | Source: Ambulatory Visit | Attending: Internal Medicine | Admitting: Internal Medicine

## 2016-05-24 DIAGNOSIS — M5134 Other intervertebral disc degeneration, thoracic region: Secondary | ICD-10-CM | POA: Diagnosis not present

## 2016-05-24 DIAGNOSIS — R918 Other nonspecific abnormal finding of lung field: Secondary | ICD-10-CM | POA: Insufficient documentation

## 2016-05-24 DIAGNOSIS — D860 Sarcoidosis of lung: Secondary | ICD-10-CM | POA: Diagnosis not present

## 2016-05-24 MED ORDER — IOPAMIDOL (ISOVUE-300) INJECTION 61%
75.0000 mL | Freq: Once | INTRAVENOUS | Status: AC | PRN
  Administered 2016-05-24: 75 mL via INTRAVENOUS

## 2016-08-01 ENCOUNTER — Other Ambulatory Visit: Payer: Self-pay | Admitting: Family Medicine

## 2016-08-01 DIAGNOSIS — R200 Anesthesia of skin: Secondary | ICD-10-CM

## 2016-08-07 ENCOUNTER — Ambulatory Visit: Payer: BLUE CROSS/BLUE SHIELD

## 2016-08-09 ENCOUNTER — Ambulatory Visit
Admission: RE | Admit: 2016-08-09 | Discharge: 2016-08-09 | Disposition: A | Discharge status: Home / Self Care | Payer: BLUE CROSS/BLUE SHIELD | Source: Ambulatory Visit | Attending: Family Medicine | Admitting: Family Medicine

## 2016-08-09 DIAGNOSIS — J321 Chronic frontal sinusitis: Secondary | ICD-10-CM | POA: Diagnosis not present

## 2016-08-09 DIAGNOSIS — R200 Anesthesia of skin: Secondary | ICD-10-CM

## 2016-08-09 DIAGNOSIS — R209 Unspecified disturbances of skin sensation: Secondary | ICD-10-CM | POA: Insufficient documentation

## 2016-08-09 MED ORDER — GADOBENATE DIMEGLUMINE 529 MG/ML IV SOLN
18.0000 mL | Freq: Once | INTRAVENOUS | Status: AC | PRN
  Administered 2016-08-09: 18 mL via INTRAVENOUS

## 2017-02-10 ENCOUNTER — Other Ambulatory Visit: Payer: Self-pay | Admitting: Family Medicine

## 2017-02-10 DIAGNOSIS — Z1231 Encounter for screening mammogram for malignant neoplasm of breast: Secondary | ICD-10-CM

## 2017-03-05 ENCOUNTER — Other Ambulatory Visit: Payer: Self-pay | Admitting: Internal Medicine

## 2017-03-07 ENCOUNTER — Ambulatory Visit
Admission: RE | Admit: 2017-03-07 | Discharge: 2017-03-07 | Disposition: A | Discharge status: Home / Self Care | Payer: BLUE CROSS/BLUE SHIELD | Source: Ambulatory Visit | Attending: Family Medicine | Admitting: Family Medicine

## 2017-03-07 DIAGNOSIS — Z1231 Encounter for screening mammogram for malignant neoplasm of breast: Secondary | ICD-10-CM

## 2017-03-30 ENCOUNTER — Emergency Department: Payer: BLUE CROSS/BLUE SHIELD

## 2017-03-30 ENCOUNTER — Emergency Department
Admission: EM | Admit: 2017-03-30 | Discharge: 2017-03-30 | Disposition: A | Discharge status: Home / Self Care | Payer: BLUE CROSS/BLUE SHIELD | Attending: Emergency Medicine | Admitting: Emergency Medicine

## 2017-03-30 ENCOUNTER — Other Ambulatory Visit: Payer: Self-pay

## 2017-03-30 ENCOUNTER — Encounter: Payer: Self-pay | Admitting: Emergency Medicine

## 2017-03-30 DIAGNOSIS — K2900 Acute gastritis without bleeding: Secondary | ICD-10-CM | POA: Diagnosis not present

## 2017-03-30 DIAGNOSIS — R112 Nausea with vomiting, unspecified: Secondary | ICD-10-CM | POA: Insufficient documentation

## 2017-03-30 DIAGNOSIS — Z7982 Long term (current) use of aspirin: Secondary | ICD-10-CM | POA: Diagnosis not present

## 2017-03-30 DIAGNOSIS — Z79899 Other long term (current) drug therapy: Secondary | ICD-10-CM | POA: Insufficient documentation

## 2017-03-30 DIAGNOSIS — M79602 Pain in left arm: Secondary | ICD-10-CM | POA: Diagnosis present

## 2017-03-30 LAB — CBC
HCT: 41.6 % (ref 35.0–47.0)
Hemoglobin: 14 g/dL (ref 12.0–16.0)
MCH: 29.7 pg (ref 26.0–34.0)
MCHC: 33.6 g/dL (ref 32.0–36.0)
MCV: 88.5 fL (ref 80.0–100.0)
PLATELETS: 198 10*3/uL (ref 150–440)
RBC: 4.7 MIL/uL (ref 3.80–5.20)
RDW: 13.3 % (ref 11.5–14.5)
WBC: 8.1 10*3/uL (ref 3.6–11.0)

## 2017-03-30 LAB — TROPONIN I: Troponin I: <0.03 ng/mL (ref <0.03)

## 2017-03-30 LAB — BASIC METABOLIC PANEL
Anion gap: 7 (ref 5–15)
BUN: 24 mg/dL — ABNORMAL HIGH (ref 6–20)
CALCIUM: 9.2 mg/dL (ref 8.9–10.3)
CO2: 27 mmol/L (ref 22–32)
CREATININE: 0.97 mg/dL (ref 0.44–1.00)
Chloride: 105 mmol/L (ref 101–111)
GFR calc non Af Amer: >60 mL/min (ref >60)
GLUCOSE: 125 mg/dL — AB (ref 65–99)
Potassium: 3.6 mmol/L (ref 3.5–5.1)
Sodium: 139 mmol/L (ref 135–145)

## 2017-03-30 MED ORDER — NAPROXEN 500 MG PO TABS
500.0000 mg | ORAL_TABLET | Freq: Two times a day (BID) | ORAL | 2 refills | Status: DC
Start: 2017-03-30 — End: 2017-06-19

## 2017-03-30 MED ORDER — ASPIRIN 81 MG PO CHEW: 324.0000 mg | CHEWABLE_TABLET | Freq: Once | ORAL | Status: DC

## 2017-03-30 MED ORDER — ONDANSETRON 4 MG PO TBDP: 4.0000 mg | ORAL_TABLET | Freq: Three times a day (TID) | ORAL | 0 refills | Status: DC | PRN

## 2017-03-30 MED ORDER — NAPROXEN 500 MG PO TABS: 500.0000 mg | ORAL_TABLET | Freq: Once | ORAL | Status: DC

## 2017-03-30 NOTE — ED Provider Notes (Signed)
[facility] Emergency Department Provider Note   ____________________________________________    I have reviewed the triage vital signs and the nursing notes.   HISTORY  Chief Complaint Arm Pain (left ) and Emesis     HPI Valerie Yu is a 62 y.o. female with history as detailed below who presents with complaints of arm pain on her left as well as a couple episodes of nausea and vomiting this morning which seems to have now resolved.  She reports that she has had pain in her left arm for over a year, primarily when she sleeps on her left side.  She gets pain and tingling down to her left hand.  This is what occurred last night.  This is not new.  She has not taken anything for this.  It has resolved.  Additionally she woke up this morning and felt nauseated and apparently vomited twice this morning.  However over the last 2 hours she has been feeling well with no nausea or vomiting.  No abdominal pain.  No fevers or chills.  No sick contacts reported.  No new medications.  No chest pain.   Past Medical History:  Diagnosis Date  . Anxiety   . GERD (gastroesophageal reflux disease)   . History of panic attacks   . Sarcoidosis   . Shortness of breath dyspnea   . Sleep apnea     Patient Active Problem List   Diagnosis Date Noted  . Sarcoidosis   . Mediastinal adenopathy 12/21/2014    Past Surgical History:  Procedure Laterality Date  . ENDOBRONCHIAL ULTRASOUND N/A 12/21/2014   Procedure: ENDOBRONCHIAL ULTRASOUND;  Surgeon: Erin Fulling, MD;  Location: [facility] ORS;  Service: Cardiopulmonary;  Laterality: N/A;    Prior to Admission medications   Medication Sig Start Date End Date Taking? Authorizing Provider  aspirin 81 MG chewable tablet Chew 81 mg by mouth daily.    [provider]  Budesonide-Formoterol Fumarate (SYMBICORT IN) Inhale 1 puff into the lungs every 12 (twelve) hours.    [provider]  fluticasone (FLONASE) 50  MCG/ACT nasal spray Place 2 sprays into both nostrils as needed for allergies or rhinitis.    [provider]  loratadine (CLARITIN) 10 MG tablet Take 10 mg by mouth daily.    [provider]  Multiple Vitamin (MULTIVITAMIN WITH MINERALS) TABS tablet Take 1 tablet by mouth daily.    [provider]  naproxen (NAPROSYN) 500 MG tablet Take 1 tablet (500 mg total) by mouth 2 (two) times daily with a meal. 03/30/17   Jene Every, MD  ondansetron (ZOFRAN ODT) 4 MG disintegrating tablet Take 1 tablet (4 mg total) by mouth every 8 (eight) hours as needed for nausea or vomiting. 03/30/17   Jene Every, MD  simvastatin (ZOCOR) 40 MG tablet Take 40 mg by mouth daily at 6 PM.    [provider]  VENTOLIN HFA 108 (90 Base) MCG/ACT inhaler INHALE TWO PUFFS BY MOUTH EVERY 4 TO 6 HOURS 03/05/17   Yevonne Pax, MD  zolpidem (AMBIEN) 10 MG tablet Take 10 mg by mouth at bedtime.    [provider]     Allergies Citalopram  Family History  Problem Relation Age of Onset  . Diabetes Mother   . CVA Mother   . Hypertension Mother   . Breast cancer Neg Hx     Social History Social History   Tobacco Use  . Smoking status: Never Smoker  . Smokeless  tobacco: Never Used  Substance Use Topics  . Alcohol use: No  . Drug use: No    Review of Systems  Constitutional: No fever/chills Eyes: No visual changes.  ENT: No neck pain Cardiovascular: Denies chest pain. Respiratory: Denies shortness of breath. Gastrointestinal: No abdominal pain.    Genitourinary: Negative for dysuria. Musculoskeletal: Arm pain as above, now resolved Skin: Negative for rash. Neurological: Negative for headaches   ____________________________________________   PHYSICAL EXAM:  VITAL SIGNS: ED Triage Vitals  Enc Vitals Group     BP 03/30/17 1336 (!) 161/78     Pulse Rate 03/30/17 1336 83     Resp 03/30/17 1336 20     Temp 03/30/17 1336 98.6 F (37 C)     Temp Source  03/30/17 1336 Oral     SpO2 03/30/17 1336 98 %     Weight 03/30/17 1337 89.4 kg (197 lb)     Height 03/30/17 1337 1.626 m (5\' 4" )     Head Circumference --      Peak Flow --      Pain Score 03/30/17 1336 5     Pain Loc --      Pain Edu? --      Excl. in GC? --     Constitutional: Alert and oriented. No acute distress. Pleasant and interactive Eyes: Conjunctivae are normal.   Nose: No congestion/rhinnorhea. Mouth/Throat: Mucous membranes are moist.   Neck:  Painless ROM, no vertebral tenderness palpation Cardiovascular: Normal rate, regular rhythm. Grossly normal heart sounds.  Good peripheral circulation. Respiratory: Normal respiratory effort.  No retractions. Lungs CTAB. Gastrointestinal: Soft and nontender. No distention.  No CVA tenderness. Genitourinary: deferred Musculoskeletal: Full range of motion of the left arm without pain, 2+ distal pulses, unremarkable exam Neurologic:  Normal speech and language. No gross focal neurologic deficits are appreciated.  Skin:  Skin is warm, dry and intact. No rash noted. Psychiatric: Mood and affect are normal. Speech and behavior are normal.  ____________________________________________   LABS (all labs ordered are listed, but only abnormal results are displayed)  Labs Reviewed  BASIC METABOLIC PANEL - Abnormal; Notable for the following components:      Result Value   Glucose, Bld 125 (*)    BUN 24 (*)    All other components within normal limits  TROPONIN I  CBC  POC URINE PREG, ED   ____________________________________________  EKG  ED ECG REPORT I, Jene Every, the attending physician, personally viewed and interpreted this ECG.  Date: 03/30/2017  Rhythm: normal sinus rhythm QRS Axis: normal Intervals: normal ST/T Wave abnormalities: normal Narrative Interpretation: no evidence of acute ischemia  ____________________________________________  RADIOLOGY  Chest x-ray  normal ____________________________________________   PROCEDURES  Procedure(s) performed: No  Procedures   Critical Care performed: No ____________________________________________   INITIAL IMPRESSION / ASSESSMENT AND PLAN / ED COURSE  Pertinent labs & imaging results that were available during my care of the patient were reviewed by me and considered in my medical decision making (see chart for details).  Patient well-appearing in no acute distress.  Suspect arm pain is related to cervical radiculopathy, given that this is been going on for some time will treat with naproxen and have the patient follow-up with PCP.  As for her nausea and vomiting I suspect she had a mild episode of gastritis this morning however she seems to be feeling much better.  Recommend supportive care.    ____________________________________________   FINAL CLINICAL IMPRESSION(S) / ED DIAGNOSES  Final  diagnoses:  Acute gastritis without hemorrhage, unspecified gastritis type        Note:  This document was prepared using Dragon voice recognition software and may include unintentional dictation errors.    Jene EveryKinner, Nakyla Bracco, MD 03/30/17 [id number]

## 2017-03-30 NOTE — ED Triage Notes (Addendum)
Pt reports left arm since 130 and emesis this morning 4 times today last episode around 1100 am, Denies any chest pain or shortness of breath, pt ambulatory no distress noted pt talks in complete sentences no distress noted

## 2017-03-30 NOTE — ED Notes (Signed)
Patient ambulatory to lobby with steady gait and NAD noted. Verbalized understanding of discharge instructions and follow-up care.  

## 2017-06-19 ENCOUNTER — Ambulatory Visit: Payer: BLUE CROSS/BLUE SHIELD | Admitting: Internal Medicine

## 2017-06-19 ENCOUNTER — Encounter: Payer: Self-pay | Admitting: Internal Medicine

## 2017-06-19 VITALS — BP 138/88 | HR 72 | Resp 16 | Ht 65.0 in | Wt 203.0 lb

## 2017-06-19 DIAGNOSIS — R079 Chest pain, unspecified: Secondary | ICD-10-CM | POA: Diagnosis not present

## 2017-06-19 DIAGNOSIS — D869 Sarcoidosis, unspecified: Secondary | ICD-10-CM

## 2017-06-19 DIAGNOSIS — R0602 Shortness of breath: Secondary | ICD-10-CM | POA: Diagnosis not present

## 2017-06-19 DIAGNOSIS — R590 Localized enlarged lymph nodes: Secondary | ICD-10-CM

## 2017-06-19 NOTE — Progress Notes (Signed)
[facility] [place], [place] [address]  Pulmonary Sleep Medicine   Office Visit Note  Patient Name: Valerie Yu DOB: 1956/01/04 MRN [id number]  Date of Service: 06/19/2017  Complaints/HPI:  She is doing relatively well.  She has had no chest pain as she was having previously.  She had a last CT scan done about a year ago which had shown the cervical prognosis nodules having been pretty stable.  She is not using her inhaler she states that she has not really noticed any difference with  Or without the inhalers  ROS  General: (-) fever, (-) chills, (-) night sweats, (-) weakness Skin: (-) rashes, (-) itching,. Eyes: (-) visual changes, (-) redness, (-) itching. Nose and Sinuses: (-) nasal stuffiness or itchiness, (-) postnasal drip, (-) nosebleeds, (-) sinus trouble. Mouth and Throat: (-) sore throat, (-) hoarseness. Neck: (-) swollen glands, (-) enlarged thyroid, (-) neck pain. Respiratory: - cough, (-) bloody sputum, - shortness of breath, - wheezing. Cardiovascular: - ankle swelling, (-) chest pain. Lymphatic: (-) lymph node enlargement. Neurologic: (-) numbness, (-) tingling. Psychiatric: (-) anxiety, (-) depression   Current Medication: Outpatient Encounter Medications as of 06/19/2017  Medication Sig  . aspirin 81 MG chewable tablet Chew 81 mg by mouth daily.  . Budesonide-Formoterol Fumarate (SYMBICORT IN) Inhale 1 puff into the lungs every 12 (twelve) hours.  . fluticasone (FLONASE) 50 MCG/ACT nasal spray Place 2 sprays into both nostrils as needed for allergies or rhinitis.  Marland Kitchen loratadine (CLARITIN) 10 MG tablet Take 10 mg by mouth daily.  . Multiple Vitamin (MULTIVITAMIN WITH MINERALS) TABS tablet Take 1 tablet by mouth daily.  . ondansetron (ZOFRAN ODT) 4 MG disintegrating tablet Take 1 tablet (4 mg total) by mouth every 8 (eight) hours as needed for nausea or vomiting.  . simvastatin (ZOCOR) 40 MG tablet Take 40 mg by mouth daily at 6 PM.   . VENTOLIN HFA 108 (90 Base) MCG/ACT inhaler INHALE TWO PUFFS BY MOUTH EVERY 4 TO 6 HOURS  . zolpidem (AMBIEN) 10 MG tablet Take 10 mg by mouth at bedtime.  . busPIRone (BUSPAR) 5 MG tablet   . [DISCONTINUED] naproxen (NAPROSYN) 500 MG tablet Take 1 tablet (500 mg total) by mouth 2 (two) times daily with a meal. (Patient not taking: Reported on 06/19/2017)   No facility-administered encounter medications on file as of 06/19/2017.     Surgical History: Past Surgical History:  Procedure Laterality Date  . ENDOBRONCHIAL ULTRASOUND N/A 12/21/2014   Procedure: ENDOBRONCHIAL ULTRASOUND;  Surgeon: Flora Lipps, MD;  Location: [facility] ORS;  Service: Cardiopulmonary;  Laterality: N/A;    Medical History: Past Medical History:  Diagnosis Date  . Anxiety   . GERD (gastroesophageal reflux disease)   . History of panic attacks   . Sarcoidosis   . Shortness of breath dyspnea   . Sleep apnea     Family History: Family History  Problem Relation Age of Onset  . Diabetes Mother   . CVA Mother   . Hypertension Mother   . Breast cancer Neg Hx     Social History: Social History   Socioeconomic History  . Marital status: Married    Spouse name: Not on file  . Number of children: Not on file  . Years of education: Not on file  . Highest education level: Not on file  Occupational History  . Not on file  Social Needs  . Financial resource strain: Not on file  . Food insecurity:  Worry: Not on file    Inability: Not on file  . Transportation needs:    Medical: Not on file    Non-medical: Not on file  Tobacco Use  . Smoking status: Never Smoker  . Smokeless tobacco: Never Used  Substance and Sexual Activity  . Alcohol use: No  . Drug use: No  . Sexual activity: Not on file  Lifestyle  . Physical activity:    Days per week: Not on file    Minutes per session: Not on file  . Stress: Not on file  Relationships  . Social connections:    Talks on phone: Not on file    Gets  together: Not on file    Attends religious service: Not on file    Active member of club or organization: Not on file    Attends meetings of clubs or organizations: Not on file    Relationship status: Not on file  . Intimate partner violence:    Fear of current or ex partner: Not on file    Emotionally abused: Not on file    Physically abused: Not on file    Forced sexual activity: Not on file  Other Topics Concern  . Not on file  Social History Narrative  . Not on file    Vital Signs: Blood pressure 138/88, pulse 72, resp. rate 16, height 5' 5" (1.651 m), weight 203 lb (92.1 kg), SpO2 98 %.  Examination: General Appearance: The patient is well-developed, well-nourished, and in no distress. Skin: Gross inspection of skin unremarkable. Head: normocephalic, no gross deformities. Eyes: no gross deformities noted. ENT: ears appear grossly normal no exudates. Neck: Supple. No thyromegaly. No LAD. Respiratory: No rhonchi noted. Cardiovascular: Normal S1 and S2 without murmur or rub. Extremities: No cyanosis. pulses are equal. Neurologic: Alert and oriented. No involuntary movements.  LABS: Recent Results (from the past 2160 hour(s))  Basic metabolic panel     Status: Abnormal   Collection Time: 03/30/17  1:35 PM  Result Value Ref Range   Sodium 139 135 - 145 mmol/L   Potassium 3.6 3.5 - 5.1 mmol/L   Chloride 105 101 - 111 mmol/L   CO2 27 22 - 32 mmol/L   Glucose, Bld 125 (H) 65 - 99 mg/dL   BUN 24 (H) 6 - 20 mg/dL   Creatinine, Ser 0.97 0.44 - 1.00 mg/dL   Calcium 9.2 8.9 - 10.3 mg/dL   GFR calc non Af Amer >60 >60 mL/min   GFR calc Af Amer >60 >60 mL/min    Comment: (NOTE) The eGFR has been calculated using the CKD EPI equation. This calculation has not been validated in all clinical situations. eGFR's persistently <60 mL/min signify possible Chronic Kidney Disease.    Anion gap 7 5 - 15    Comment: Performed at [facility], [address] [place],  [place] [address]  Troponin I  (0, 3)     Status: None   Collection Time: 03/30/17  1:35 PM  Result Value Ref Range   Troponin I <0.03 <0.03 ng/mL    Comment: Performed at [facility], [address] [place], [place] [address]  CBC     Status: None   Collection Time: 03/30/17  1:35 PM  Result Value Ref Range   WBC 8.1 3.6 - 11.0 K/uL   RBC 4.70 3.80 - 5.20 MIL/uL   Hemoglobin 14.0 12.0 - 16.0 g/dL   HCT 41.6 35.0 - 47.0 %   MCV 88.5 80.0 -  100.0 fL   MCH 29.7 26.0 - 34.0 pg   MCHC 33.6 32.0 - 36.0 g/dL   RDW 13.3 11.5 - 14.5 %   Platelets 198 150 - 440 K/uL    Comment: Performed at [facility], [address], [place], [place] [address]    Radiology: Dg Chest 2 View  Result Date: 03/30/2017 CLINICAL DATA:  Left arm pain. EXAM: CHEST  2 VIEW COMPARISON:  CT chest 05/24/2016 FINDINGS: The heart size and mediastinal contours are within normal limits. Both lungs are clear. The visualized skeletal structures are unremarkable. IMPRESSION: No active cardiopulmonary disease. Electronically Signed   By: Kathreen Devoid   On: 03/30/2017 14:31    No results found.  No results found.    Assessment and Plan: Patient Active Problem List   Diagnosis Date Noted  . Sarcoidosis   . Mediastinal adenopathy 12/21/2014    1. Sarcoidosis continue with present management follow up CT scan to see if there has been any progression of the nodules 2. Mediastinal adenopathy is noted with followup the CT scan continue with supportive care 3. Chest Pain non cardiac stable at this time 4. SOB has improved and has been not using her inhalers as prescribed so will try to stay off the inhalers  General Counseling: I have discussed the findings of the evaluation and examination with Valerie Yu.  I have also discussed any further diagnostic evaluation thatmay be needed or ordered today. Valerie Yu verbalizes understanding of the findings of todays visit. We also reviewed her medications today and discussed  drug interactions and side effects including but not limited excessive drowsiness and altered mental states. We also discussed that there is always a risk not just to her but also people around her. she has been encouraged to call the office with any questions or concerns that should arise related to todays visit.    Time spent: 59mn  I have personally obtained a history, examined the patient, evaluated laboratory and imaging results, formulated the assessment and plan and placed orders.    SAllyne Gee MD [facility] Sleep [facility]

## 2017-06-19 NOTE — Patient Instructions (Signed)

## 2017-06-23 ENCOUNTER — Telehealth: Payer: Self-pay | Admitting: Internal Medicine

## 2017-06-23 NOTE — Telephone Encounter (Signed)
PATIENT WAS CALLED AND INFORMED OF APPT. AT OUTPT [email or web address]

## 2017-07-09 ENCOUNTER — Ambulatory Visit: Payer: Self-pay | Admitting: Internal Medicine

## 2017-07-18 ENCOUNTER — Ambulatory Visit
Admission: RE | Admit: 2017-07-18 | Discharge: 2017-07-18 | Disposition: A | Discharge status: Home / Self Care | Payer: BLUE CROSS/BLUE SHIELD | Source: Ambulatory Visit | Attending: Internal Medicine | Admitting: Internal Medicine

## 2017-07-18 DIAGNOSIS — D869 Sarcoidosis, unspecified: Secondary | ICD-10-CM

## 2017-07-23 ENCOUNTER — Ambulatory Visit: Payer: BLUE CROSS/BLUE SHIELD | Admitting: Internal Medicine

## 2017-07-23 DIAGNOSIS — R0602 Shortness of breath: Secondary | ICD-10-CM

## 2017-07-23 LAB — PULMONARY FUNCTION TEST

## 2017-07-24 NOTE — Procedures (Signed)
[facility] [address] [place], [address]  DATE OF SERVICE: Jul 23, 2017  Complete Pulmonary Function Testing Interpretation:  FINDINGS:  Forced vital capacity is normal.  The FEV1 is normal.  FEV1 FVC ratio is normal.  Postbronchodilator there is no significant change in the FEV1 however clinical improvement may occur in the absence of spirometric improvement does not preclude the use of bronchodilators.  Total lung capacity is normal residual volume is normal residual volume total lung capacity ratio is decreased FRC is normal.  The DLCO is within normal limits.  IMPRESSION:  This pulmonary function studies within normal limits Clinical correlation is recommended  Yevonne Pax, MD [facility] Pulmonary Critical Care Medicine Sleep Medicine

## 2017-08-18 ENCOUNTER — Ambulatory Visit: Payer: BLUE CROSS/BLUE SHIELD

## 2017-09-02 ENCOUNTER — Ambulatory Visit
Admission: RE | Admit: 2017-09-02 | Discharge: 2017-09-02 | Disposition: A | Discharge status: Home / Self Care | Payer: BLUE CROSS/BLUE SHIELD | Source: Ambulatory Visit | Attending: Internal Medicine | Admitting: Internal Medicine

## 2017-09-02 DIAGNOSIS — D869 Sarcoidosis, unspecified: Secondary | ICD-10-CM | POA: Diagnosis present

## 2017-09-02 DIAGNOSIS — R918 Other nonspecific abnormal finding of lung field: Secondary | ICD-10-CM | POA: Diagnosis not present

## 2017-09-06 ENCOUNTER — Other Ambulatory Visit: Payer: Self-pay | Admitting: Internal Medicine

## 2017-09-18 ENCOUNTER — Other Ambulatory Visit: Payer: Self-pay

## 2017-09-22 ENCOUNTER — Ambulatory Visit: Payer: BLUE CROSS/BLUE SHIELD | Admitting: Internal Medicine

## 2017-09-22 ENCOUNTER — Encounter: Payer: Self-pay | Admitting: Internal Medicine

## 2017-09-22 VITALS — BP 120/80 | HR 70 | Resp 16 | Ht 65.0 in | Wt 201.0 lb

## 2017-09-22 DIAGNOSIS — D869 Sarcoidosis, unspecified: Secondary | ICD-10-CM | POA: Diagnosis not present

## 2017-09-22 DIAGNOSIS — R079 Chest pain, unspecified: Secondary | ICD-10-CM | POA: Diagnosis not present

## 2017-09-22 DIAGNOSIS — R590 Localized enlarged lymph nodes: Secondary | ICD-10-CM | POA: Diagnosis not present

## 2017-09-22 NOTE — Patient Instructions (Signed)
Sarcoidosis Sarcoidosis is a disease that causes inflammation in your organs and other areas of your body. The lungs are most often affected (pulmonary sarcoidosis). Sarcoidosis can also affect your lymph nodes, liver, eyes, skin, or any other body tissue. When you have sarcoidosis, small clumps of tissue (granulomas) form in the affected area of your body. Granulomas are made up of your body's defense (immune) cells. Inflammation results when your body reacts to a harmful substance. Normally, inflammation goes away after immune cells get rid of the harmful substance. In sarcoidosis, the immune cells form granulomas instead. What are the causes? The exact cause of sarcoidosis is not known. Something triggers the immune system to respond, such as dust, chemicals, bacteria, or a virus. What increases the risk? You may be at a greater risk for sarcoidosis if you:  Have a family history of the disease.  Are African American.  Are of Northern European ancestry.  Are 20-50 years old.  Are female.  What are the signs or symptoms? Many people with sarcoidosis have no symptoms. Others have very mild symptoms. Sarcoidosis most often affects the lungs. Symptoms include:  Chest pain.  Coughing.  Wheezing.  Shortness of breath.  Other common symptoms include:  Night sweats.  Weight loss.  Fatigue.  Depression.  A sense of uneasiness.  How is this diagnosed? Sarcoidosis may be diagnosed by:  Medical history and physical exam.  Chest X-ray. This looks for granulomas in your lungs.  Lung function tests. These measure your breathing and look for problems related to sarcoidosis.  Examining a sample of tissue under a microscope (biopsy).  How is this treated? Sarcoidosis usually clears up without treatment. You may take medicines to reduce inflammation or relieve symptoms. These may include:  Prednisone. This steroid reduces inflammation related to sarcoidosis.  Chloroquine or  hydroxychloroquine. These are antimalarial medicines used to treat sarcoidosis that affects the skin or brain.  Methotrexate, leflunomide, or azathioprine. These medicines affect the immune system and can help with sarcoidosis in the joints, eyes, skin, or lungs.  Inhalers. Inhaled medicines can help you breathe if sarcoidosis is affecting your lungs.  Follow these instructions at home:  Do not use any tobacco products, including cigarettes, chewing tobacco, or electronic cigarettes. If you need help quitting, ask your health care provider.  Avoid secondhand smoke.  Avoid irritating dust and chemicals. Stay indoors on days when air quality is poor in your area.  Take medicines only as directed by your health care provider. Contact a health care provider if:  You have vision problems.  You have shortness of breath.  You have a dry, persistent cough.  You have an irregular heartbeat.  You have pain or ache in your joints, hands, or feet.  You have an unexplained rash. Get help right away if: You have chest pain. This information is not intended to replace advice given to you by your health care provider. Make sure you discuss any questions you have with your health care provider. Document Released: 12/13/2003 Document Revised: 07/20/2015 Document Reviewed: 06/09/2013 Elsevier Interactive Patient Education  2018 [employer]vier Inc.  

## 2017-09-22 NOTE — Progress Notes (Signed)
[facility] [address] [place], [place] [address]  Pulmonary Sleep Medicine   Office Visit Note  Patient Name: Valerie Yu DOB: 09/11/1955 MRN [id number]  Date of Service: 09/22/2017  Complaints/HPI:   Patient is here for follow-up of sarcoidosis she had a CT scan done which shows stable nodules and no change in her had not the.  The patient states that she is having no major shortness of breath.  She is having twinges of chest pain which are very atypical.  Should be noted that she has had workup for cardiac and her cardiac workup was unremarkable.  The patient states that she has no cough no congestion.  She still does note some hoarseness of her voice.  Currently she is on Symbicort and albuterol.  The patient states that she feels may be related to the Symbicort.  As far as her pulmonary function studies are concerned they were within normal limits and I think she can actually discontinue the Symbicort at this stage  ROS  General: (-) fever, (-) chills, (-) night sweats, (-) weakness Skin: (-) rashes, (-) itching,. Eyes: (-) visual changes, (-) redness, (-) itching. Nose and Sinuses: (-) nasal stuffiness or itchiness, (-) postnasal drip, (-) nosebleeds, (-) sinus trouble. Mouth and Throat: (-) sore throat, (-) hoarseness. Neck: (-) swollen glands, (-) enlarged thyroid, (-) neck pain. Respiratory: - cough, (-) bloody sputum, - shortness of breath, - wheezing. Cardiovascular: - ankle swelling, (-) chest pain. Lymphatic: (-) lymph node enlargement. Neurologic: (-) numbness, (-) tingling. Psychiatric: (-) anxiety, (-) depression   Current Medication: Outpatient Encounter Medications as of 09/22/2017  Medication Sig  . albuterol (PROVENTIL HFA;VENTOLIN HFA) 108 (90 Base) MCG/ACT inhaler INHALE 2 PUFFS BY MOUTH EVERY 4 TO 6 HOURS  . aspirin 81 MG chewable tablet Chew 81 mg by mouth daily.  . Budesonide-Formoterol Fumarate (SYMBICORT IN) Inhale 1 puff into the  lungs every 12 (twelve) hours.  . busPIRone (BUSPAR) 5 MG tablet   . fluticasone (FLONASE) 50 MCG/ACT nasal spray Place 2 sprays into both nostrils as needed for allergies or rhinitis.  Marland Kitchen loratadine (CLARITIN) 10 MG tablet Take 10 mg by mouth daily.  . Multiple Vitamin (MULTIVITAMIN WITH MINERALS) TABS tablet Take 1 tablet by mouth daily.  . ondansetron (ZOFRAN ODT) 4 MG disintegrating tablet Take 1 tablet (4 mg total) by mouth every 8 (eight) hours as needed for nausea or vomiting.  . simvastatin (ZOCOR) 40 MG tablet Take 40 mg by mouth daily at 6 PM.  . zolpidem (AMBIEN) 10 MG tablet Take 10 mg by mouth at bedtime.   No facility-administered encounter medications on file as of 09/22/2017.     Surgical History: Past Surgical History:  Procedure Laterality Date  . ENDOBRONCHIAL ULTRASOUND N/A 12/21/2014   Procedure: ENDOBRONCHIAL ULTRASOUND;  Surgeon: Erin Fulling, MD;  Location: [facility] ORS;  Service: Cardiopulmonary;  Laterality: N/A;    Medical History: Past Medical History:  Diagnosis Date  . Anxiety   . GERD (gastroesophageal reflux disease)   . History of panic attacks   . Sarcoidosis   . Shortness of breath dyspnea   . Sleep apnea     Family History: Family History  Problem Relation Age of Onset  . Diabetes Mother   . CVA Mother   . Hypertension Mother   . Breast cancer Neg Hx     Social History: Social History   Socioeconomic History  . Marital status: Married    Spouse name: Not on file  .  Number of children: Not on file  . Years of education: Not on file  . Highest education level: Not on file  Occupational History  . Not on file  Social Needs  . Financial resource strain: Not on file  . Food insecurity:    Worry: Not on file    Inability: Not on file  . Transportation needs:    Medical: Not on file    Non-medical: Not on file  Tobacco Use  . Smoking status: Never Smoker  . Smokeless tobacco: Never Used  Substance and Sexual Activity  . Alcohol use:  No  . Drug use: No  . Sexual activity: Not on file  Lifestyle  . Physical activity:    Days per week: Not on file    Minutes per session: Not on file  . Stress: Not on file  Relationships  . Social connections:    Talks on phone: Not on file    Gets together: Not on file    Attends religious service: Not on file    Active member of club or organization: Not on file    Attends meetings of clubs or organizations: Not on file    Relationship status: Not on file  . Intimate partner violence:    Fear of current or ex partner: Not on file    Emotionally abused: Not on file    Physically abused: Not on file    Forced sexual activity: Not on file  Other Topics Concern  . Not on file  Social History Narrative  . Not on file    Vital Signs: Blood pressure 120/80, pulse 70, resp. rate 16, height 5\' 5"  (1.651 m), weight 201 lb (91.2 kg), SpO2 97 %.  Examination: General Appearance: The patient is well-developed, well-nourished, and in no distress. Skin: Gross inspection of skin unremarkable. Head: normocephalic, no gross deformities. Eyes: no gross deformities noted. ENT: ears appear grossly normal no exudates. Neck: Supple. No thyromegaly. No LAD. Respiratory: no rhonchi noted at this time. Cardiovascular: Normal S1 and S2 without murmur or rub. Extremities: No cyanosis. pulses are equal. Neurologic: Alert and oriented. No involuntary movements.  LABS: No results found for this or any previous visit (from the past 2160 hour(s)).  Radiology: Ct Chest Wo Contrast  Result Date: 09/02/2017 CLINICAL DATA:  History of sarcoidosis, follow-up of lung nodules EXAM: CT CHEST WITHOUT CONTRAST TECHNIQUE: Multidetector CT imaging of the chest was performed following the standard protocol without IV contrast. COMPARISON:  Chest x-ray of 03/30/2017, CT chest of 05/24/2016 and CT chest of 04/26/2015 FINDINGS: Cardiovascular: The heart is within normal limits in size. No pericardial effusion is  seen. There may be faint coronary artery calcification in the distribution of the left anterior descending coronary artery. Mediastinum/Nodes: The previously noted mediastinal and hilar adenopathy appears relatively stable. The hilar adenopathy is more difficult to assess without IV contrast media but no enlarging nodes are evident. These changes most likely represent changes of sarcoidosis. The thyroid gland is unremarkable. There does appear to be a small hiatal hernia present. Lungs/Pleura: On lung window images, multiple nodules are present throughout the lungs. On the right, there is a 2 mm nodule which abuts the minor fissure on image 64 series 3. On image 66 series 3 a 3 mm nodule abuts the minor fissure is well. The dominant nodules however in the right lung are on image 68. A 9 mm nodule abuts the minor fissure on image 68 series 3 and and 8 mm nodule abuts  the major fissure on the same image. On the left, a nodule is adjacent to a pulmonary artery branch on image 53 measuring 7 mm. Another nodule on image 61 is adjacent to a vessel measuring 7 mm. A 6 mm nodule is within the superior segment left lower lobe abutting the major fissure on image 66, and a 6 mm nodule on image 69 abuts the major fissure within the inferior aspect of the left upper lobe. A 6 mm nodule in the on image 87 abuts the major fissure being within the anterior left lower lobe. All of these nodules appear relatively stable, and being associated with fissures to a large degree, most likely represent enlarged perifissural lymph nodes. The dominant nodules were visualized on the CT lung screening scan of 2017 and have remained stable. Therefore no additional follow-up is necessary unless the patient is considered high risk. Upper Abdomen: The portion of the upper abdomen that is visualized is unremarkable. Musculoskeletal: The thoracic vertebrae are normal alignment with mild degenerative change in the mid to lower thoracic spine.  IMPRESSION: 1. The vast majority of the nodules identified on prior CTs of the chest are associated with fissures, and most likely represent prominent perifissural lymph nodes in this patient with sarcoidosis. No enlarging or new nodule is evident. No additional CT follow-up is necessary unless the patient is considered high risk. 2. It is difficult to assess hilar adenopathy without IV contrast media but no apparent change is seen. Electronically Signed   By: Dwyane Dee M.D.   On: 09/02/2017 15:39    No results found.  Ct Chest Wo Contrast  Result Date: 09/02/2017 CLINICAL DATA:  History of sarcoidosis, follow-up of lung nodules EXAM: CT CHEST WITHOUT CONTRAST TECHNIQUE: Multidetector CT imaging of the chest was performed following the standard protocol without IV contrast. COMPARISON:  Chest x-ray of 03/30/2017, CT chest of 05/24/2016 and CT chest of 04/26/2015 FINDINGS: Cardiovascular: The heart is within normal limits in size. No pericardial effusion is seen. There may be faint coronary artery calcification in the distribution of the left anterior descending coronary artery. Mediastinum/Nodes: The previously noted mediastinal and hilar adenopathy appears relatively stable. The hilar adenopathy is more difficult to assess without IV contrast media but no enlarging nodes are evident. These changes most likely represent changes of sarcoidosis. The thyroid gland is unremarkable. There does appear to be a small hiatal hernia present. Lungs/Pleura: On lung window images, multiple nodules are present throughout the lungs. On the right, there is a 2 mm nodule which abuts the minor fissure on image 64 series 3. On image 66 series 3 a 3 mm nodule abuts the minor fissure is well. The dominant nodules however in the right lung are on image 68. A 9 mm nodule abuts the minor fissure on image 68 series 3 and and 8 mm nodule abuts the major fissure on the same image. On the left, a nodule is adjacent to a pulmonary  artery branch on image 53 measuring 7 mm. Another nodule on image 61 is adjacent to a vessel measuring 7 mm. A 6 mm nodule is within the superior segment left lower lobe abutting the major fissure on image 66, and a 6 mm nodule on image 69 abuts the major fissure within the inferior aspect of the left upper lobe. A 6 mm nodule in the on image 87 abuts the major fissure being within the anterior left lower lobe. All of these nodules appear relatively stable, and being associated with fissures  to a large degree, most likely represent enlarged perifissural lymph nodes. The dominant nodules were visualized on the CT lung screening scan of 2017 and have remained stable. Therefore no additional follow-up is necessary unless the patient is considered high risk. Upper Abdomen: The portion of the upper abdomen that is visualized is unremarkable. Musculoskeletal: The thoracic vertebrae are normal alignment with mild degenerative change in the mid to lower thoracic spine. IMPRESSION: 1. The vast majority of the nodules identified on prior CTs of the chest are associated with fissures, and most likely represent prominent perifissural lymph nodes in this patient with sarcoidosis. No enlarging or new nodule is evident. No additional CT follow-up is necessary unless the patient is considered high risk. 2. It is difficult to assess hilar adenopathy without IV contrast media but no apparent change is seen. Electronically Signed   By: Dwyane Dee M.D.   On: 09/02/2017 15:39      Assessment and Plan: Patient Active Problem List   Diagnosis Date Noted  . Sarcoidosis   . Mediastinal adenopathy 12/21/2014    1. Sarcoidosis will continue with supportive care.  Discontinue the Symbicort will reassess if her symptoms should come back.  We will continue with the following up periodically currently she will come back in about 6 months. 2. Meadiastinal Adenopathy  Other adenopathy was stable she will continue with supportive care  no further CTs are recommended based on her stability 3. Chest Pain atypical chest pain noncardiac the patient is going to follow up with her primary care physician to further investigate causes of her pain I do not think it is related to her pulmonary issues  General Counseling: I have discussed the findings of the evaluation and examination with Kevin Fenton.  I have also discussed any further diagnostic evaluation thatmay be needed or ordered today. Zenna verbalizes understanding of the findings of todays visit. We also reviewed her medications today and discussed drug interactions and side effects including but not limited excessive drowsiness and altered mental states. We also discussed that there is always a risk not just to her but also people around her. she has been encouraged to call the office with any questions or concerns that should arise related to todays visit.    Time spent:  I have personally obtained a history, examined the patient, evaluated laboratory and imaging results, formulated the assessment and plan and placed orders.    Yevonne Pax, MD [facility] Pulmonary and Critical Care Sleep medicine

## 2017-09-25 ENCOUNTER — Ambulatory Visit: Payer: Self-pay | Admitting: Internal Medicine

## 2018-03-03 ENCOUNTER — Other Ambulatory Visit: Payer: Self-pay | Admitting: Family Medicine

## 2018-03-03 DIAGNOSIS — Z1231 Encounter for screening mammogram for malignant neoplasm of breast: Secondary | ICD-10-CM

## 2018-03-25 ENCOUNTER — Ambulatory Visit
Admission: RE | Admit: 2018-03-25 | Discharge: 2018-03-25 | Disposition: A | Discharge status: Home / Self Care | Payer: BLUE CROSS/BLUE SHIELD | Source: Ambulatory Visit | Attending: Family Medicine | Admitting: Family Medicine

## 2018-03-25 DIAGNOSIS — Z1231 Encounter for screening mammogram for malignant neoplasm of breast: Secondary | ICD-10-CM | POA: Diagnosis not present

## 2018-03-26 ENCOUNTER — Ambulatory Visit: Payer: BLUE CROSS/BLUE SHIELD | Admitting: Internal Medicine

## 2018-03-26 ENCOUNTER — Encounter: Payer: Self-pay | Admitting: Internal Medicine

## 2018-03-26 VITALS — BP 128/80 | HR 68 | Resp 16 | Ht 65.0 in | Wt 204.0 lb

## 2018-03-26 DIAGNOSIS — D869 Sarcoidosis, unspecified: Secondary | ICD-10-CM

## 2018-03-26 DIAGNOSIS — R590 Localized enlarged lymph nodes: Secondary | ICD-10-CM | POA: Diagnosis not present

## 2018-03-26 DIAGNOSIS — R0602 Shortness of breath: Secondary | ICD-10-CM

## 2018-03-26 NOTE — Patient Instructions (Addendum)
Sarcoidosis ° °Sarcoidosis is a disease that can cause inflammation in many areas of the body. It most often affects the lungs (pulmonary sarcoidosis). Sarcoidosis can also affect the lymph nodes, liver, eyes, skin, heart, or any other body tissue. °Normally, cells that are part of your body's disease-fighting system (immune system) attack harmful substances (such as germs) in your body. This immune system response causes inflammation. After the harmful substance is destroyed, the inflammation and the immune cells go away. When you have sarcoidosis, your immune system causes inflammation even when there are no harmful substances, and the inflammation does not go away. Sarcoidosis also causes cells from your immune system to form small clumps of tissue (granulomas) in the affected area of your body. °What are the causes? °The exact cause of sarcoidosis is not known.  °It is possible that if you have a family history of this disease (genetic predisposition), the immune system response that leads to inflammation may be triggered by something in your environment, such as: °· Bacteria or viruses. °· Metals. °· Chemicals. °· Dust. °· Mold or mildew. °What increases the risk? °You may be at a greater risk for sarcoidosis if you: °· Have a family history of the disease. °· Are African-American. °· Are of Northern European descent. °· Are 20-50 years old. °· Work as a firefighter. °· Work in an environment where you are exposed to metals, chemicals, mold or mildew, or insecticides. °What are the signs or symptoms? °Some people with sarcoidosis have no symptoms. Others have very mild symptoms. The symptoms usually depend on the organ that is affected. Sarcoidosis most often affects the lungs, which may include symptoms such as: °· Chest pain. °· Coughing. °· Wheezing. °· Shortness of breath. °Other common symptoms include: °· Night sweats. °· Fever. °· Weight loss. °· Fatigue. °· Swollen lymph nodes. °· Joint pain. °How is  this diagnosed? °Sarcoidosis may be diagnosed based on: °· Your symptoms and medical history. °· A physical exam. °· Imaging tests to check for granulomas such as: °? Chest X-ray. °? CT scan. °? MRI. °? PET scan. °· Lung function tests. These tests evaluate your breathing and check for problems that may be related to sarcoidosis. °· A procedure to remove a tissue sample for testing (biopsy). You may have a biopsy of lung tissue if that is where you are having symptoms. °You may have tests to check for any complications of the condition. These tests may include: °· Eye exams. °· MRI of the heart or brain. °· Echocardiogram. °· Electrocardiogram (EKG or ECG). °How is this treated? °In some cases, sarcoidosis does not require a specific treatment because it causes no symptoms or mild symptoms. If your symptoms bother you or are severe, you may be prescribed medicines to reduce inflammation or relieve symptoms. These medicines may include: °· Prednisone. This is a steroid that reduces inflammation related to sarcoidosis. °· Hydroxychloroquine. This may be used to treat sarcoidosis that affects the skin, eyes, or brain. °· Methotrexate, leflunomide, or azathioprine. These medicines affect the immune system and can help with sarcoidosis in the joints, eyes, skin, or lungs. °· Medicines that you breathe in (inhalers). Inhalers can help you breathe if sarcoidosis affects your lungs. °Follow these instructions at home: ° °· Do not use any products that contain nicotine or tobacco, such as cigarettes and e-cigarettes. If you need help quitting, ask your health care provider. °· Avoid secondhand smoke and irritating dust or chemicals. Stay indoors on days when air quality is poor   in your area.  Return to your normal activities as told by your health care provider. Ask your health care provider what activities are safe for you.  Take or use over-the-counter and prescription medicines only as told by your health care  provider.  Keep all follow-up visits as told by your health care provider. This is important. Contact a health care provider if:  You have vision problems.  You have a dry cough that does not go away.  You have an irregular heartbeat.  You have pain or aches in your joints, hands, or feet.  You have an unexplained rash. Get help right away if:  You have chest pain.  You have difficulty breathing. Summary  Sarcoidosis is a disease that can cause inflammation in many body areas of the body. It most often affects the lungs (pulmonary sarcoidosis). It can also affect the lymph nodes, liver, eyes, skin, heart, or any other body tissue.  When you have sarcoidosis, cells from your immune system form small clumps of tissue (granulomas) in the affected area of your body.  Sarcoidosis sometimes does not require a specific treatment because it causes no symptoms or mild symptoms.  If your symptoms bother you or are severe, you may be prescribed medicines to reduce inflammation or relieve symptoms. This information is not intended to replace advice given to you by your health care provider. Make sure you discuss any questions you have with your health care provider. Document Released: 12/13/2003 Document Revised: 11/19/2016 Document Reviewed: 11/19/2016 Elsevier Interactive Patient Education  2019 [employer].  Sarcoidosis  Sarcoidosis is a disease that can cause inflammation in many areas of the body. It most often affects the lungs (pulmonary sarcoidosis). Sarcoidosis can also affect the lymph nodes, liver, eyes, skin, heart, or any other body tissue. Normally, cells that are part of your body's disease-fighting system (immune system) attack harmful substances (such as germs) in your body. This immune system response causes inflammation. After the harmful substance is destroyed, the inflammation and the immune cells go away. When you have sarcoidosis, your immune system causes inflammation  even when there are no harmful substances, and the inflammation does not go away. Sarcoidosis also causes cells from your immune system to form small clumps of tissue (granulomas) in the affected area of your body. What are the causes? The exact cause of sarcoidosis is not known.  It is possible that if you have a family history of this disease (genetic predisposition), the immune system response that leads to inflammation may be triggered by something in your environment, such as:  Bacteria or viruses.  Metals.  Chemicals.  Dust.  Mold or mildew. What increases the risk? You may be at a greater risk for sarcoidosis if you:  Have a family history of the disease.  Are African-American.  Are of Northern European descent.  Are 83-72 years old.  Work as a IT sales professional.  Work in an environment where you are exposed to metals, chemicals, mold or mildew, or insecticides. What are the signs or symptoms? Some people with sarcoidosis have no symptoms. Others have very mild symptoms. The symptoms usually depend on the organ that is affected. Sarcoidosis most often affects the lungs, which may include symptoms such as:  Chest pain.  Coughing.  Wheezing.  Shortness of breath. Other common symptoms include:  Night sweats.  Fever.  Weight loss.  Fatigue.  Swollen lymph nodes.  Joint pain. How is this diagnosed? Sarcoidosis may be diagnosed based on:  Your symptoms and  medical history.  A physical exam.  Imaging tests to check for granulomas such as: ? Chest X-ray. ? CT scan. ? MRI. ? PET scan.  Lung function tests. These tests evaluate your breathing and check for problems that may be related to sarcoidosis.  A procedure to remove a tissue sample for testing (biopsy). You may have a biopsy of lung tissue if that is where you are having symptoms. You may have tests to check for any complications of the condition. These tests may include:  Eye exams.  MRI of the  heart or brain.  Echocardiogram.  Electrocardiogram (EKG or ECG). How is this treated? In some cases, sarcoidosis does not require a specific treatment because it causes no symptoms or mild symptoms. If your symptoms bother you or are severe, you may be prescribed medicines to reduce inflammation or relieve symptoms. These medicines may include:  Prednisone. This is a steroid that reduces inflammation related to sarcoidosis.  Hydroxychloroquine. This may be used to treat sarcoidosis that affects the skin, eyes, or brain.  Methotrexate, leflunomide, or azathioprine. These medicines affect the immune system and can help with sarcoidosis in the joints, eyes, skin, or lungs.  Medicines that you breathe in (inhalers). Inhalers can help you breathe if sarcoidosis affects your lungs. Follow these instructions at home:   Do not use any products that contain nicotine or tobacco, such as cigarettes and e-cigarettes. If you need help quitting, ask your health care provider.  Avoid secondhand smoke and irritating dust or chemicals. Stay indoors on days when air quality is poor in your area.  Return to your normal activities as told by your health care provider. Ask your health care provider what activities are safe for you.  Take or use over-the-counter and prescription medicines only as told by your health care provider.  Keep all follow-up visits as told by your health care provider. This is important. Contact a health care provider if:  You have vision problems.  You have a dry cough that does not go away.  You have an irregular heartbeat.  You have pain or aches in your joints, hands, or feet.  You have an unexplained rash. Get help right away if:  You have chest pain.  You have difficulty breathing. Summary  Sarcoidosis is a disease that can cause inflammation in many body areas of the body. It most often affects the lungs (pulmonary sarcoidosis). It can also affect the lymph  nodes, liver, eyes, skin, heart, or any other body tissue.  When you have sarcoidosis, cells from your immune system form small clumps of tissue (granulomas) in the affected area of your body.  Sarcoidosis sometimes does not require a specific treatment because it causes no symptoms or mild symptoms.  If your symptoms bother you or are severe, you may be prescribed medicines to reduce inflammation or relieve symptoms. This information is not intended to replace advice given to you by your health care provider. Make sure you discuss any questions you have with your health care provider. Document Released: 12/13/2003 Document Revised: 11/19/2016 Document Reviewed: 11/19/2016 Elsevier Interactive Patient Education  2019 [employer].

## 2018-03-26 NOTE — Progress Notes (Signed)
[facility] [address] [place], [place] [address]  Pulmonary Sleep Medicine   Office Visit Note  Patient Name: Valerie Yu DOB: [birth year] MRN [id number]  Date of Service: 04/05/2018  Complaints/HPI: Pt is here for follow up.  She reports overall she is doing well.  She denies any major shortness of breath or other respiratory symptoms.  She continues to report some intermittent atypical chest pain which has been associated with her sarcoidosis. She denies cough, congestion, or sinus symptoms.  She is using albuterol and Symbicort and she is managing well.   ROS  General: (-) fever, (-) chills, (-) night sweats, (-) weakness Skin: (-) rashes, (-) itching,. Eyes: (-) visual changes, (-) redness, (-) itching. Nose and Sinuses: (-) nasal stuffiness or itchiness, (-) postnasal drip, (-) nosebleeds, (-) sinus trouble. Mouth and Throat: (-) sore throat, (-) hoarseness. Neck: (-) swollen glands, (-) enlarged thyroid, (-) neck pain. Respiratory: - cough, (-) bloody sputum, - shortness of breath, - wheezing. Cardiovascular: - ankle swelling, (-) chest pain. Lymphatic: (-) lymph node enlargement. Neurologic: (-) numbness, (-) tingling. Psychiatric: (-) anxiety, (-) depression   Current Medication: Outpatient Encounter Medications as of 03/26/2018  Medication Sig  . albuterol (PROVENTIL HFA;VENTOLIN HFA) 108 (90 Base) MCG/ACT inhaler INHALE 2 PUFFS BY MOUTH EVERY 4 TO 6 HOURS  . aspirin 81 MG chewable tablet Chew 81 mg by mouth daily.  . busPIRone (BUSPAR) 5 MG tablet   . fluticasone (FLONASE) 50 MCG/ACT nasal spray Place 2 sprays into both nostrils as needed for allergies or rhinitis.  Marland Kitchen loratadine (CLARITIN) 10 MG tablet Take 10 mg by mouth daily.  . Multiple Vitamin (MULTIVITAMIN WITH MINERALS) TABS tablet Take 1 tablet by mouth daily.  . ondansetron (ZOFRAN ODT) 4 MG disintegrating tablet Take 1 tablet (4 mg total) by mouth every 8 (eight) hours as needed for nausea  or vomiting.  . simvastatin (ZOCOR) 40 MG tablet Take 40 mg by mouth daily at 6 PM.  . zolpidem (AMBIEN) 10 MG tablet Take 10 mg by mouth at bedtime.  . Budesonide-Formoterol Fumarate (SYMBICORT IN) Inhale 1 puff into the lungs every 12 (twelve) hours.   No facility-administered encounter medications on file as of 03/26/2018.     Surgical History: Past Surgical History:  Procedure Laterality Date  . ENDOBRONCHIAL ULTRASOUND N/A 12/21/2014   Procedure: ENDOBRONCHIAL ULTRASOUND;  Surgeon: Erin Fulling, MD;  Location: [facility] ORS;  Service: Cardiopulmonary;  Laterality: N/A;    Medical History: Past Medical History:  Diagnosis Date  . Anxiety   . GERD (gastroesophageal reflux disease)   . History of panic attacks   . Sarcoidosis   . Shortness of breath dyspnea   . Sleep apnea     Family History: Family History  Problem Relation Age of Onset  . Diabetes Mother   . CVA Mother   . Hypertension Mother   . Breast cancer Neg Hx     Social History: Social History   Socioeconomic History  . Marital status: Married    Spouse name: Not on file  . Number of children: Not on file  . Years of education: Not on file  . Highest education level: Not on file  Occupational History  . Not on file  Social Needs  . Financial resource strain: Not on file  . Food insecurity:    Worry: Not on file    Inability: Not on file  . Transportation needs:    Medical: Not on file    Non-medical:  Not on file  Tobacco Use  . Smoking status: Never Smoker  . Smokeless tobacco: Never Used  Substance and Sexual Activity  . Alcohol use: No  . Drug use: No  . Sexual activity: Not on file  Lifestyle  . Physical activity:    Days per week: Not on file    Minutes per session: Not on file  . Stress: Not on file  Relationships  . Social connections:    Talks on phone: Not on file    Gets together: Not on file    Attends religious service: Not on file    Active member of club or organization: Not on  file    Attends meetings of clubs or organizations: Not on file    Relationship status: Not on file  . Intimate partner violence:    Fear of current or ex partner: Not on file    Emotionally abused: Not on file    Physically abused: Not on file    Forced sexual activity: Not on file  Other Topics Concern  . Not on file  Social History Narrative  . Not on file    Vital Signs: Blood pressure 128/80, pulse 68, resp. rate 16, height 5\' 5"  (1.651 m), weight 204 lb (92.5 kg), SpO2 97 %.  Examination: General Appearance: The patient is well-developed, well-nourished, and in no distress. Skin: Gross inspection of skin unremarkable. Head: normocephalic, no gross deformities. Eyes: no gross deformities noted. ENT: ears appear grossly normal no exudates. Neck: Supple. No thyromegaly. No LAD. Respiratory: clear bilaterally. Cardiovascular: Normal S1 and S2 without murmur or rub. Extremities: No cyanosis. pulses are equal. Neurologic: Alert and oriented. No involuntary movements.  LABS: No results found for this or any previous visit (from the past 2160 hour(s)).  Radiology: Mm 3d Screen Breast Bilateral  Result Date: 03/25/2018 CLINICAL DATA:  Screening. EXAM: DIGITAL SCREENING BILATERAL MAMMOGRAM WITH TOMO AND CAD COMPARISON:  Previous exam(s). ACR Breast Density Category b: There are scattered areas of fibroglandular density. FINDINGS: There are no findings suspicious for malignancy. Images were processed with CAD. IMPRESSION: No mammographic evidence of malignancy. A result letter of this screening mammogram will be mailed directly to the patient. RECOMMENDATION: Screening mammogram in one year. (Code:SM-B-01Y) BI-RADS CATEGORY  1: Negative. Electronically Signed   By: Frederico HammanMichelle  Collins M.D.   On: 03/25/2018 14:32    No results found.  Mm 3d Screen Breast Bilateral  Result Date: 03/25/2018 CLINICAL DATA:  Screening. EXAM: DIGITAL SCREENING BILATERAL MAMMOGRAM WITH TOMO AND CAD  COMPARISON:  Previous exam(s). ACR Breast Density Category b: There are scattered areas of fibroglandular density. FINDINGS: There are no findings suspicious for malignancy. Images were processed with CAD. IMPRESSION: No mammographic evidence of malignancy. A result letter of this screening mammogram will be mailed directly to the patient. RECOMMENDATION: Screening mammogram in one year. (Code:SM-B-01Y) BI-RADS CATEGORY  1: Negative. Electronically Signed   By: Frederico HammanMichelle  Collins M.D.   On: 03/25/2018 14:32      Assessment and Plan: Patient Active Problem List   Diagnosis Date Noted  . Sarcoidosis   . Mediastinal adenopathy 12/21/2014   1. Sarcoidosis Stable, pt will continue to use inhalers as directed.  PFT's will be repeated this year.   2. Mediastinal adenopathy Stable as of 09/02/2017, no follow up CT recommended at this time.   3. SOB (shortness of breath) FEV1/FVC 61% which is 76% of pre-predicted value.  - Spirometry with Graph   General Counseling: I have discussed the findings  of the evaluation and examination with Kevin FentonJerome.  I have also discussed any further diagnostic evaluation thatmay be needed or ordered today. Kevin FentonJerome verbalizes understanding of the findings of todays visit. We also reviewed her medications today and discussed drug interactions and side effects including but not limited excessive drowsiness and altered mental states. We also discussed that there is always a risk not just to her but also people around her. she has been encouraged to call the office with any questions or concerns that should arise related to todays visit.    Time spent: 25 This patient was seen by Blima LedgerAdam Sarh Kirschenbaum AGNP-C in Collaboration with Dr. Freda MunroSaadat Khan as a part of collaborative care agreement.  I have personally obtained a history, examined the patient, evaluated laboratory and imaging results, formulated the assessment and plan and placed orders.    Yevonne PaxSaadat A Khan, MD [facility] Pulmonary and  Critical Care Sleep medicine

## 2018-05-14 ENCOUNTER — Other Ambulatory Visit: Payer: Self-pay | Admitting: Internal Medicine

## 2018-06-29 ENCOUNTER — Ambulatory Visit: Payer: BC Managed Care – PPO | Admitting: Internal Medicine

## 2018-06-29 ENCOUNTER — Other Ambulatory Visit: Payer: Self-pay

## 2018-06-29 ENCOUNTER — Encounter: Payer: Self-pay | Admitting: Internal Medicine

## 2018-06-29 VITALS — BP 135/85 | HR 76 | Resp 16 | Ht 65.0 in | Wt 205.0 lb

## 2018-06-29 DIAGNOSIS — R079 Chest pain, unspecified: Secondary | ICD-10-CM

## 2018-06-29 DIAGNOSIS — D869 Sarcoidosis, unspecified: Secondary | ICD-10-CM

## 2018-06-29 NOTE — Progress Notes (Signed)
[facility] [address] [place], [place] [address]  Internal MEDICINE  Telephone Visit  Patient Name: Valerie Yu  [id number]  [id number]  Date of Service: 06/29/2018  I connected with the patient at 10;41 by telephone and verified the patients identity using two identifiers.   I discussed the limitations, risks, security and privacy concerns of performing an evaluation and management service by telephone and the availability of in person appointments. I also discussed with the patient that there may be a patient responsible charge related to the service.  The patient expressed understanding and agrees to proceed.    Chief Complaint  Patient presents with  . Telephone Screen  . Sarcoidosis    Needs a note for work , works at [employer] , positive covid   . Telephone Assessment    HPI  Pt is worried about her lungs since she has a diagnosis of sarcoidosis, chest pain off on on but improves on its own, gets better after getting out of bed. Sob is improved after using her inhalers. She works n an assisted facility    Current Medication: Outpatient Encounter Medications as of 06/29/2018  Medication Sig  . aspirin 81 MG chewable tablet Chew 81 mg by mouth daily.  . Budesonide-Formoterol Fumarate (SYMBICORT IN) Inhale 1 puff into the lungs every 12 (twelve) hours.  . busPIRone (BUSPAR) 5 MG tablet   . fluticasone (FLONASE) 50 MCG/ACT nasal spray Place 2 sprays into both nostrils as needed for allergies or rhinitis.  Marland Kitchen loratadine (CLARITIN) 10 MG tablet Take 10 mg by mouth daily.  . meclizine (ANTIVERT) 25 MG tablet TAKE ONE HALF TO 1 TABLET BY MOUTH EVERY 6 HOURS AS NEEDED FOR DIZZINESS. WATCH FOR SEDATION.  . Multiple Vitamin (MULTIVITAMIN WITH MINERALS) TABS tablet Take 1 tablet by mouth daily.  . ondansetron (ZOFRAN ODT) 4 MG disintegrating tablet Take 1 tablet (4 mg total) by mouth every 8 (eight) hours as needed for nausea or vomiting.  . simvastatin (ZOCOR) 40  MG tablet Take 40 mg by mouth daily at 6 PM.  . VENTOLIN HFA 108 (90 Base) MCG/ACT inhaler INHALE 2 PUFFS BY MOUTH EVERY 4 TO 6 HOURS  . zolpidem (AMBIEN) 10 MG tablet Take 10 mg by mouth at bedtime.   No facility-administered encounter medications on file as of 06/29/2018.     Surgical History: Past Surgical History:  Procedure Laterality Date  . ENDOBRONCHIAL ULTRASOUND N/A 12/21/2014   Procedure: ENDOBRONCHIAL ULTRASOUND;  Surgeon: Erin Fulling, MD;  Location: [facility] ORS;  Service: Cardiopulmonary;  Laterality: N/A;    Medical History: Past Medical History:  Diagnosis Date  . Anxiety   . GERD (gastroesophageal reflux disease)   . History of panic attacks   . Sarcoidosis   . Shortness of breath dyspnea   . Sleep apnea     Family History: Family History  Problem Relation Age of Onset  . Diabetes Mother   . CVA Mother   . Hypertension Mother   . Breast cancer Neg Hx     Social History   Socioeconomic History  . Marital status: Married    Spouse name: Not on file  . Number of children: Not on file  . Years of education: Not on file  . Highest education level: Not on file  Occupational History  . Not on file  Social Needs  . Financial resource strain: Not on file  . Food insecurity:    Worry: Not on file    Inability: Not  on file  . Transportation needs:    Medical: Not on file    Non-medical: Not on file  Tobacco Use  . Smoking status: Never Smoker  . Smokeless tobacco: Never Used  Substance and Sexual Activity  . Alcohol use: No  . Drug use: No  . Sexual activity: Not on file  Lifestyle  . Physical activity:    Days per week: Not on file    Minutes per session: Not on file  . Stress: Not on file  Relationships  . Social connections:    Talks on phone: Not on file    Gets together: Not on file    Attends religious service: Not on file    Active member of club or organization: Not on file    Attends meetings of clubs or organizations: Not on file     Relationship status: Not on file  . Intimate partner violence:    Fear of current or ex partner: Not on file    Emotionally abused: Not on file    Physically abused: Not on file    Forced sexual activity: Not on file  Other Topics Concern  . Not on file  Social History Narrative  . Not on file   Review of Systems  Vital Signs: BP 135/85   Pulse 76   Resp 16   Ht 5\' 5"  (1.651 m)   Wt 205 lb (93 kg)   BMI 34.11 kg/m    Observation/Objective:  Pt is connected via webcam, pleasant to talk in no acute distress    Assessment/Plan: 1. Sarcoidosis - Pt to continue all meds, she is high risk   2. Chest pain, unspecified type - Cardiac work up has been negative in the past. reassurance is given   General Counseling: Garrison ColumbusJerome verbalizes understanding of the findings of today's phone visit and agrees with plan of treatment. I have discussed any further diagnostic evaluation that may be needed or ordered today. We also reviewed her medications today. she has been encouraged to call the office with any questions or concerns that should arise related to todays visit.   Time spent: 4715 Minutes    Dr Lyndon CodeFozia M Vella Colquitt Internal medicine

## 2018-10-01 ENCOUNTER — Encounter: Payer: Self-pay | Admitting: Internal Medicine

## 2018-10-01 ENCOUNTER — Other Ambulatory Visit: Payer: Self-pay

## 2018-10-01 ENCOUNTER — Ambulatory Visit: Payer: BC Managed Care – PPO | Admitting: Internal Medicine

## 2018-10-01 VITALS — BP 126/76 | HR 67 | Resp 16 | Ht 65.0 in | Wt 206.0 lb

## 2018-10-01 DIAGNOSIS — D869 Sarcoidosis, unspecified: Secondary | ICD-10-CM

## 2018-10-01 DIAGNOSIS — R0602 Shortness of breath: Secondary | ICD-10-CM | POA: Diagnosis not present

## 2018-10-01 DIAGNOSIS — R079 Chest pain, unspecified: Secondary | ICD-10-CM

## 2018-10-01 MED ORDER — VENTOLIN HFA 108 (90 BASE) MCG/ACT IN AERS: 2.0000 | INHALATION_SPRAY | RESPIRATORY_TRACT | 3 refills | Status: DC | PRN

## 2018-10-01 NOTE — Progress Notes (Signed)
[facility] [place], [place] [address]  Pulmonary Sleep Medicine   Office Visit Note  Patient Name: Valerie Yu DOB: 1955-06-19 MRN [id number]  Date of Service: 10/01/2018  Complaints/HPI: PT is here for follow up on sarcoidosis. Overall is doing well.  She reports she is using her ventolin inhaler about once every 2 weeks.  She denise any hospitalizations. She continues to have some intermittent chest pain, due to her sarcoid.  It is becoming rarer at this time.     ROS  General: (-) fever, (-) chills, (-) night sweats, (-) weakness Skin: (-) rashes, (-) itching,. Eyes: (-) visual changes, (-) redness, (-) itching. Nose and Sinuses: (-) nasal stuffiness or itchiness, (-) postnasal drip, (-) nosebleeds, (-) sinus trouble. Mouth and Throat: (-) sore throat, (-) hoarseness. Neck: (-) swollen glands, (-) enlarged thyroid, (-) neck pain. Respiratory: - cough, (-) bloody sputum, - shortness of breath, - wheezing. Cardiovascular: - ankle swelling, (-) chest pain. Lymphatic: (-) lymph node enlargement. Neurologic: (-) numbness, (-) tingling. Psychiatric: (-) anxiety, (-) depression   Current Medication: Outpatient Encounter Medications as of 10/01/2018  Medication Sig  . aspirin 81 MG chewable tablet Chew 81 mg by mouth daily.  . busPIRone (BUSPAR) 5 MG tablet   . fluticasone (FLONASE) 50 MCG/ACT nasal spray Place 2 sprays into both nostrils as needed for allergies or rhinitis.  Marland Kitchen loratadine (CLARITIN) 10 MG tablet Take 10 mg by mouth daily.  . meclizine (ANTIVERT) 25 MG tablet TAKE ONE HALF TO 1 TABLET BY MOUTH EVERY 6 HOURS AS NEEDED FOR DIZZINESS. WATCH FOR SEDATION.  . Multiple Vitamin (MULTIVITAMIN WITH MINERALS) TABS tablet Take 1 tablet by mouth daily.  . NON FORMULARY cpap device  . ondansetron (ZOFRAN ODT) 4 MG disintegrating tablet Take 1 tablet (4 mg total) by mouth every 8 (eight) hours as needed for nausea or vomiting.  . simvastatin (ZOCOR)  40 MG tablet Take 40 mg by mouth daily at 6 PM.  . VENTOLIN HFA 108 (90 Base) MCG/ACT inhaler INHALE 2 PUFFS BY MOUTH EVERY 4 TO 6 HOURS  . zolpidem (AMBIEN) 10 MG tablet Take 10 mg by mouth at bedtime.  . [DISCONTINUED] Budesonide-Formoterol Fumarate (SYMBICORT IN) Inhale 1 puff into the lungs every 12 (twelve) hours.   No facility-administered encounter medications on file as of 10/01/2018.     Surgical History: Past Surgical History:  Procedure Laterality Date  . ENDOBRONCHIAL ULTRASOUND N/A 12/21/2014   Procedure: ENDOBRONCHIAL ULTRASOUND;  Surgeon: Flora Lipps, MD;  Location: [facility] ORS;  Service: Cardiopulmonary;  Laterality: N/A;    Medical History: Past Medical History:  Diagnosis Date  . Anxiety   . GERD (gastroesophageal reflux disease)   . History of panic attacks   . Sarcoidosis   . Shortness of breath dyspnea   . Sleep apnea     Family History: Family History  Problem Relation Age of Onset  . Diabetes Mother   . CVA Mother   . Hypertension Mother   . Breast cancer Neg Hx     Social History: Social History   Socioeconomic History  . Marital status: Married    Spouse name: Not on file  . Number of children: Not on file  . Years of education: Not on file  . Highest education level: Not on file  Occupational History  . Not on file  Social Needs  . Financial resource strain: Not on file  . Food insecurity    Worry: Not on file  Inability: Not on file  . Transportation needs    Medical: Not on file    Non-medical: Not on file  Tobacco Use  . Smoking status: Never Smoker  . Smokeless tobacco: Never Used  Substance and Sexual Activity  . Alcohol use: No  . Drug use: No  . Sexual activity: Not on file  Lifestyle  . Physical activity    Days per week: Not on file    Minutes per session: Not on file  . Stress: Not on file  Relationships  . Social Musicianconnections    Talks on phone: Not on file    Gets together: Not on file    Attends religious service:  Not on file    Active member of club or organization: Not on file    Attends meetings of clubs or organizations: Not on file    Relationship status: Not on file  . Intimate partner violence    Fear of current or ex partner: Not on file    Emotionally abused: Not on file    Physically abused: Not on file    Forced sexual activity: Not on file  Other Topics Concern  . Not on file  Social History Narrative  . Not on file    Vital Signs: Blood pressure 126/76, pulse 67, resp. rate 16, height 5\' 5"  (1.651 m), weight 206 lb (93.4 kg), SpO2 98 %.  Examination: General Appearance: The patient is well-developed, well-nourished, and in no distress. Skin: Gross inspection of skin unremarkable. Head: normocephalic, no gross deformities. Eyes: no gross deformities noted. ENT: ears appear grossly normal no exudates. Neck: Supple. No thyromegaly. No LAD. Respiratory: clear bilateraly. Cardiovascular: Normal S1 and S2 without murmur or rub. Extremities: No cyanosis. pulses are equal. Neurologic: Alert and oriented. No involuntary movements.  LABS: No results found for this or any previous visit (from the past 2160 hour(s)).  Radiology: Mm 3d Screen Breast Bilateral  Result Date: 03/25/2018 CLINICAL DATA:  Screening. EXAM: DIGITAL SCREENING BILATERAL MAMMOGRAM WITH TOMO AND CAD COMPARISON:  Previous exam(s). ACR Breast Density Category b: There are scattered areas of fibroglandular density. FINDINGS: There are no findings suspicious for malignancy. Images were processed with CAD. IMPRESSION: No mammographic evidence of malignancy. A result letter of this screening mammogram will be mailed directly to the patient. RECOMMENDATION: Screening mammogram in one year. (Code:SM-B-01Y) BI-RADS CATEGORY  1: Negative. Electronically Signed   By: Frederico HammanMichelle  Collins M.D.   On: 03/25/2018 14:32    No results found.  No results found.    Assessment and Plan: Patient Active Problem List   Diagnosis Date  Noted  . Sarcoidosis   . Mediastinal adenopathy 12/21/2014    1. Sarcoidosis Refilled ventolin inhaler for patient.  Stable, PFT ordered for surveillance.  Continue present management.  - VENTOLIN HFA 108 (90 Base) MCG/ACT inhaler; Inhale 2 puffs into the lungs every 4 (four) hours as needed for wheezing or shortness of breath.  Dispense: 18 g; Refill: 3 - Pulmonary Function Test; Future  2. Chest pain, unspecified type Intermittent, from sarcoid.  Improving per patient.  3. SOB (shortness of breath) FEV! 2.1, and 94% of pre-predicted value. - Spirometry with Graph  General Counseling: I have discussed the findings of the evaluation and examination with Kevin FentonJerome.  I have also discussed any further diagnostic evaluation thatmay be needed or ordered today. Kevin FentonJerome verbalizes understanding of the findings of todays visit. We also reviewed her medications today and discussed drug interactions and side effects including but not  limited excessive drowsiness and altered mental states. We also discussed that there is always a risk not just to her but also people around her. she has been encouraged to call the office with any questions or concerns that should arise related to todays visit.    Time spent: 15 This patient was seen by Blima LedgerAdam Dorethia Jeanmarie AGNP-C in Collaboration with Dr. Freda MunroSaadat Khan as a part of collaborative care agreement.   I have personally obtained a history, examined the patient, evaluated laboratory and imaging results, formulated the assessment and plan and placed orders.    Yevonne PaxSaadat A Khan, MD [facility] Pulmonary and Critical Care Sleep medicine

## 2018-10-21 ENCOUNTER — Ambulatory Visit (INDEPENDENT_AMBULATORY_CARE_PROVIDER_SITE_OTHER): Payer: BC Managed Care – PPO | Admitting: Internal Medicine

## 2018-10-21 DIAGNOSIS — R0602 Shortness of breath: Secondary | ICD-10-CM

## 2018-10-21 DIAGNOSIS — D869 Sarcoidosis, unspecified: Secondary | ICD-10-CM

## 2018-10-21 LAB — PULMONARY FUNCTION TEST

## 2018-10-22 NOTE — Procedures (Signed)
[place], [address]  DATE OF SERVICE: October 21, 2018  Complete Pulmonary Function Testing Interpretation:  FINDINGS:  The forced vital capacity is normal.  The FEV1 is normal.  FEV1 FVC ratio is normal.  Postbronchodilator no significant change in the FEV1.  Clinical improvement may still occur in the absence of spirometric improvement.  Total lung capacity is normal residual volume is decreased residual anti-lung capacity ratio is decreased thoracic gas volume is normal.  DLCO is within normal limits.  IMPRESSION:  This pulmonary function study is within normal limits.  There is no change postbronchodilator clinical correlation is recommended.  Patient did have some technical difficulty with the study however the study is still within normal limits  Allyne Gee, MD [facility] Pulmonary Critical Care Medicine Sleep Medicine

## 2019-02-15 ENCOUNTER — Other Ambulatory Visit: Payer: Self-pay | Admitting: Family Medicine

## 2019-02-15 DIAGNOSIS — Z1231 Encounter for screening mammogram for malignant neoplasm of breast: Secondary | ICD-10-CM

## 2019-03-31 ENCOUNTER — Ambulatory Visit
Admission: RE | Admit: 2019-03-31 | Discharge: 2019-03-31 | Disposition: A | Discharge status: Home / Self Care | Payer: BC Managed Care – PPO | Source: Ambulatory Visit | Attending: Family Medicine | Admitting: Family Medicine

## 2019-03-31 DIAGNOSIS — Z1231 Encounter for screening mammogram for malignant neoplasm of breast: Secondary | ICD-10-CM | POA: Diagnosis not present

## 2019-04-06 ENCOUNTER — Telehealth: Payer: Self-pay

## 2019-04-06 NOTE — Telephone Encounter (Signed)
Confirmed appointment on 04/07/2013 and screened for covid,. klh

## 2019-04-08 ENCOUNTER — Other Ambulatory Visit: Payer: Self-pay

## 2019-04-08 ENCOUNTER — Encounter: Payer: Self-pay | Admitting: Internal Medicine

## 2019-04-08 ENCOUNTER — Ambulatory Visit: Payer: BC Managed Care – PPO | Admitting: Internal Medicine

## 2019-04-08 VITALS — BP 137/87 | HR 65 | Temp 97.2°F | Resp 16 | Ht 65.0 in | Wt 204.8 lb

## 2019-04-08 DIAGNOSIS — R0602 Shortness of breath: Secondary | ICD-10-CM

## 2019-04-08 DIAGNOSIS — R079 Chest pain, unspecified: Secondary | ICD-10-CM | POA: Diagnosis not present

## 2019-04-08 DIAGNOSIS — D869 Sarcoidosis, unspecified: Secondary | ICD-10-CM

## 2019-04-08 NOTE — Progress Notes (Signed)
[facility] [place], [place] [address]  Pulmonary Sleep Medicine   Office Visit Note  Patient Name: Valerie Yu DOB: [birth year] MRN [id number]  Date of Service: 04/08/2019  Complaints/HPI: Patient is here today to follow-up on her sarcoidosis. She reports overall she has been doing very well. In the past, she was using her albuterol inhaler as needed, she reports she has not used her inhaler in quite a while. She denies shortness of breath or cough. Continues to complain of intermittent chest pain that has been ongoing for many years. Denies increase in frequency or severity of pain, is not currently taking medications to help with pain, states it only lasts a short time and then resolves on its own.  ROS  General: (-) fever, (-) chills, (-) night sweats, (-) weakness Skin: (-) rashes, (-) itching,. Eyes: (-) visual changes, (-) redness, (-) itching. Nose and Sinuses: (-) nasal stuffiness or itchiness, (-) postnasal drip, (-) nosebleeds, (-) sinus trouble. Mouth and Throat: (-) sore throat, (-) hoarseness. Neck: (-) swollen glands, (-) enlarged thyroid, (-) neck pain. Respiratory: - cough, (-) bloody sputum, - shortness of breath, - wheezing. Cardiovascular: - ankle swelling, (-) chest pain. Lymphatic: (-) lymph node enlargement. Neurologic: (-) numbness, (-) tingling. Psychiatric: (-) anxiety, (-) depression   Current Medication: Outpatient Encounter Medications as of 04/08/2019  Medication Sig  . aspirin 81 MG chewable tablet Chew 81 mg by mouth daily.  . busPIRone (BUSPAR) 5 MG tablet   . fluticasone (FLONASE) 50 MCG/ACT nasal spray Place 2 sprays into both nostrils as needed for allergies or rhinitis.  Marland Kitchen loratadine (CLARITIN) 10 MG tablet Take 10 mg by mouth daily.  . meclizine (ANTIVERT) 25 MG tablet TAKE ONE HALF TO 1 TABLET BY MOUTH EVERY 6 HOURS AS NEEDED FOR DIZZINESS. WATCH FOR SEDATION.  . Multiple Vitamin (MULTIVITAMIN WITH MINERALS)  TABS tablet Take 1 tablet by mouth daily.  . NON FORMULARY cpap device  . ondansetron (ZOFRAN ODT) 4 MG disintegrating tablet Take 1 tablet (4 mg total) by mouth every 8 (eight) hours as needed for nausea or vomiting.  . simvastatin (ZOCOR) 40 MG tablet Take 40 mg by mouth daily at 6 PM.  . VENTOLIN HFA 108 (90 Base) MCG/ACT inhaler Inhale 2 puffs into the lungs every 4 (four) hours as needed for wheezing or shortness of breath.  . zolpidem (AMBIEN) 10 MG tablet Take 10 mg by mouth at bedtime.   No facility-administered encounter medications on file as of 04/08/2019.    Surgical History: Past Surgical History:  Procedure Laterality Date  . ENDOBRONCHIAL ULTRASOUND N/A 12/21/2014   Procedure: ENDOBRONCHIAL ULTRASOUND;  Surgeon: Flora Lipps, MD;  Location: [facility] ORS;  Service: Cardiopulmonary;  Laterality: N/A;    Medical History: Past Medical History:  Diagnosis Date  . Anxiety   . GERD (gastroesophageal reflux disease)   . History of panic attacks   . Sarcoidosis   . Shortness of breath dyspnea   . Sleep apnea     Family History: Family History  Problem Relation Age of Onset  . Diabetes Mother   . CVA Mother   . Hypertension Mother   . Breast cancer Neg Hx     Social History: Social History   Socioeconomic History  . Marital status: Married    Spouse name: Not on file  . Number of children: Not on file  . Years of education: Not on file  . Highest education level: Not on file  Occupational History  .  Not on file  Tobacco Use  . Smoking status: Never Smoker  . Smokeless tobacco: Never Used  Substance and Sexual Activity  . Alcohol use: No  . Drug use: No  . Sexual activity: Not on file  Other Topics Concern  . Not on file  Social History Narrative  . Not on file   Social Determinants of Health   Financial Resource Strain:   . Difficulty of Paying Living Expenses: Not on file  Food Insecurity:   . Worried About Programme researcher, broadcasting/film/video in the Last Year: Not on  file  . Ran Out of Food in the Last Year: Not on file  Transportation Needs:   . Lack of Transportation (Medical): Not on file  . Lack of Transportation (Non-Medical): Not on file  Physical Activity:   . Days of Exercise per Week: Not on file  . Minutes of Exercise per Session: Not on file  Stress:   . Feeling of Stress : Not on file  Social Connections:   . Frequency of Communication with Friends and Family: Not on file  . Frequency of Social Gatherings with Friends and Family: Not on file  . Attends Religious Services: Not on file  . Active Member of Clubs or Organizations: Not on file  . Attends Banker Meetings: Not on file  . Marital Status: Not on file  Intimate Partner Violence:   . Fear of Current or Ex-Partner: Not on file  . Emotionally Abused: Not on file  . Physically Abused: Not on file  . Sexually Abused: Not on file    Vital Signs: Blood pressure 137/87, pulse 65, temperature (!) 97.2 F (36.2 C), resp. rate 16, height 5\' 5"  (1.651 m), weight 204 lb 12.8 oz (92.9 kg), SpO2 98 %.  Examination: General Appearance: The patient is well-developed, well-nourished, and in no distress. Skin: Gross inspection of skin unremarkable. Head: normocephalic, no gross deformities. Eyes: no gross deformities noted. ENT: ears appear grossly normal no exudates. Neck: Supple. No thyromegaly. No LAD. Respiratory: Lung sounds clear bilaterally. Cardiovascular: Normal S1 and S2 without murmur or rub. Extremities: No cyanosis. pulses are equal. Neurologic: Alert and oriented. No involuntary movements.  LABS: No results found for this or any previous visit (from the past 2160 hour(s)).  Radiology: MM 3D SCREEN BREAST BILATERAL  Result Date: 03/31/2019 CLINICAL DATA:  Screening. EXAM: DIGITAL SCREENING BILATERAL MAMMOGRAM WITH TOMO AND CAD COMPARISON:  Previous exam(s). ACR Breast Density Category b: There are scattered areas of fibroglandular density. FINDINGS: There  are no findings suspicious for malignancy. Images were processed with CAD. IMPRESSION: No mammographic evidence of malignancy. A result letter of this screening mammogram will be mailed directly to the patient. RECOMMENDATION: Screening mammogram in one year. (Code:SM-B-01Y) BI-RADS CATEGORY  1: Negative. Electronically Signed   By: 05/29/2019 M.D.   On: 03/31/2019 12:39    No results found.  MM 3D SCREEN BREAST BILATERAL  Result Date: 03/31/2019 CLINICAL DATA:  Screening. EXAM: DIGITAL SCREENING BILATERAL MAMMOGRAM WITH TOMO AND CAD COMPARISON:  Previous exam(s). ACR Breast Density Category b: There are scattered areas of fibroglandular density. FINDINGS: There are no findings suspicious for malignancy. Images were processed with CAD. IMPRESSION: No mammographic evidence of malignancy. A result letter of this screening mammogram will be mailed directly to the patient. RECOMMENDATION: Screening mammogram in one year. (Code:SM-B-01Y) BI-RADS CATEGORY  1: Negative. Electronically Signed   By: 05/29/2019 M.D.   On: 03/31/2019 12:39  Assessment and Plan: Patient Active Problem List   Diagnosis Date Noted  . Sarcoidosis   . Mediastinal adenopathy 12/21/2014    1. Sarcoidosis Stable at this time on current therapy, continue to monitor symptoms. Symptoms and last chest CT does not warrant repeat scanning at this time.  2. Chest pain, unspecified type Continues to have intermittent chest pain related to sarcoidosis. Has been stable on last several years without medication therapy, requests to continue to monitor pain symptoms without medications at this time.  3. SOB (shortness of breath) - Spirometry with Graph  General Counseling: I have discussed the findings of the evaluation and examination with Kevin Fenton.  I have also discussed any further diagnostic evaluation thatmay be needed or ordered today. Sabina verbalizes understanding of the findings of todays visit. We also reviewed her  medications today and discussed drug interactions and side effects including but not limited excessive drowsiness and altered mental states. We also discussed that there is always a risk not just to her but also people around her. she has been encouraged to call the office with any questions or concerns that should arise related to todays visit.  Orders Placed This Encounter  Procedures  . Spirometry with Graph    Order Specific Question:   Where should this test be performed?    Answer:   [facility]     Time spent: 30 including 10 minutes of chart review This patient was seen by Blima Ledger AGNP-C in Collaboration with Dr. Freda Munro as a part of collaborative care agreement.  I have personally obtained a history, examined the patient, evaluated laboratory and imaging results, formulated the assessment and plan and placed orders.    Yevonne Pax, MD [facility] Pulmonary and Critical Care Sleep medicine

## 2019-04-25 ENCOUNTER — Emergency Department: Payer: BC Managed Care – PPO

## 2019-04-25 ENCOUNTER — Other Ambulatory Visit: Payer: Self-pay

## 2019-04-25 ENCOUNTER — Emergency Department
Admission: EM | Admit: 2019-04-25 | Discharge: 2019-04-25 | Disposition: A | Discharge status: Home / Self Care | Payer: BC Managed Care – PPO | Attending: Emergency Medicine | Admitting: Emergency Medicine

## 2019-04-25 DIAGNOSIS — R0789 Other chest pain: Secondary | ICD-10-CM | POA: Diagnosis not present

## 2019-04-25 DIAGNOSIS — Z7982 Long term (current) use of aspirin: Secondary | ICD-10-CM | POA: Insufficient documentation

## 2019-04-25 DIAGNOSIS — D869 Sarcoidosis, unspecified: Secondary | ICD-10-CM | POA: Diagnosis not present

## 2019-04-25 DIAGNOSIS — R079 Chest pain, unspecified: Secondary | ICD-10-CM | POA: Diagnosis present

## 2019-04-25 LAB — BASIC METABOLIC PANEL WITH GFR
Anion gap: 8 (ref 5–15)
BUN: 22 mg/dL (ref 8–23)
CO2: 26 mmol/L (ref 22–32)
Calcium: 9.1 mg/dL (ref 8.9–10.3)
Chloride: 106 mmol/L (ref 98–111)
Creatinine, Ser: 0.88 mg/dL (ref 0.44–1.00)
GFR calc Af Amer: >60 mL/min
GFR calc non Af Amer: >60 mL/min
Glucose, Bld: 128 mg/dL — ABNORMAL HIGH (ref 70–99)
Potassium: 3.8 mmol/L (ref 3.5–5.1)
Sodium: 140 mmol/L (ref 135–145)

## 2019-04-25 LAB — CBC
HCT: 36.4 % (ref 36.0–46.0)
Hemoglobin: 12.4 g/dL (ref 12.0–15.0)
MCH: 29.7 pg (ref 26.0–34.0)
MCHC: 34.1 g/dL (ref 30.0–36.0)
MCV: 87.1 fL (ref 80.0–100.0)
Platelets: 197 K/uL (ref 150–400)
RBC: 4.18 MIL/uL (ref 3.87–5.11)
RDW: 12.9 % (ref 11.5–15.5)
WBC: 5.9 K/uL (ref 4.0–10.5)
nRBC: 0 % (ref 0.0–0.2)

## 2019-04-25 LAB — TROPONIN I (HIGH SENSITIVITY)
Troponin I (High Sensitivity): <2 ng/L (ref <18)
Troponin I (High Sensitivity): <2 ng/L (ref <18)

## 2019-04-25 MED ORDER — SODIUM CHLORIDE 0.9 % IV BOLUS
1000.0000 mL | Freq: Once | INTRAVENOUS | Status: AC
  Administered 2019-04-25: 1000 mL via INTRAVENOUS

## 2019-04-25 MED ORDER — IOHEXOL 350 MG/ML SOLN
75.0000 mL | Freq: Once | INTRAVENOUS | Status: AC | PRN
  Administered 2019-04-25: 75 mL via INTRAVENOUS

## 2019-04-25 NOTE — Discharge Instructions (Signed)
Your CT scan showed sarcoidosis.   Talk to your doctor regarding if you need steroids or not   Return to ER if you have worse shortness of breath, chest pain, trouble breathing

## 2019-04-25 NOTE — ED Triage Notes (Signed)
Pt comes in with CP starting this morning on and off while cooking. Pt has sarcoidosis and states the pain might be from that. Pt states pain is more on the left. Pt states having periods of "blacking out" then getting nauseous.

## 2019-04-25 NOTE — ED Notes (Signed)
Pt moved to ED bed 5 from hallway.  GCS 15, c/o generalized chest pain and weakness since this am while cooking.  No acute s/s of distress noted. Placed on bedside cardiac monitor. Will continue to monitor/reassess

## 2019-04-25 NOTE — ED Provider Notes (Signed)
[facility] EMERGENCY DEPARTMENT Provider Note   CSN: [id number] Arrival date & time: 04/25/19  1502     History Chief Complaint  Patient presents with  . Chest Pain    Valerie Yu is a 64 y.o. female hx of GERD, sarcoidosis, here presenting with chest pain.  Patient has been having chest pain on the left side since this morning.  She does a lot of cooking but this is baseline for her.  Denies any trauma or injury.  She does have some subjective shortness of breath and dizziness.  She had several episode both where she states that she almost passed out.  States that pain radiate down the left arm.  Patient has a history of sarcoidosis.  She saw her doctor recently and she is not currently on any steroids or immunosuppressants.  The history is provided by the patient.       Past Medical History:  Diagnosis Date  . Anxiety   . GERD (gastroesophageal reflux disease)   . History of panic attacks   . Sarcoidosis   . Shortness of breath dyspnea   . Sleep apnea     Patient Active Problem List   Diagnosis Date Noted  . Sarcoidosis   . Mediastinal adenopathy 12/21/2014    Past Surgical History:  Procedure Laterality Date  . ENDOBRONCHIAL ULTRASOUND N/A 12/21/2014   Procedure: ENDOBRONCHIAL ULTRASOUND;  Surgeon: Erin Fulling, MD;  Location: [facility] ORS;  Service: Cardiopulmonary;  Laterality: N/A;     OB History   No obstetric history on file.     Family History  Problem Relation Age of Onset  . Diabetes Mother   . CVA Mother   . Hypertension Mother   . Breast cancer Neg Hx     Social History   Tobacco Use  . Smoking status: Never Smoker  . Smokeless tobacco: Never Used  Substance Use Topics  . Alcohol use: No  . Drug use: No    Home Medications Prior to Admission medications   Medication Sig Start Date End Date Taking? Authorizing Provider  aspirin 81 MG chewable tablet Chew 81 mg by mouth daily.    [provider]    busPIRone (BUSPAR) 5 MG tablet  05/15/17   [provider]  fluticasone (FLONASE) 50 MCG/ACT nasal spray Place 2 sprays into both nostrils as needed for allergies or rhinitis.    [provider]  loratadine (CLARITIN) 10 MG tablet Take 10 mg by mouth daily.    [provider]  meclizine (ANTIVERT) 25 MG tablet TAKE ONE HALF TO 1 TABLET BY MOUTH EVERY 6 HOURS AS NEEDED FOR DIZZINESS. WATCH FOR SEDATION. 06/09/18   [provider]  Multiple Vitamin (MULTIVITAMIN WITH MINERALS) TABS tablet Take 1 tablet by mouth daily.    [provider]  NON FORMULARY cpap device    [provider]  ondansetron (ZOFRAN ODT) 4 MG disintegrating tablet Take 1 tablet (4 mg total) by mouth every 8 (eight) hours as needed for nausea or vomiting. 03/30/17   Jene Every, MD  simvastatin (ZOCOR) 40 MG tablet Take 40 mg by mouth daily at 6 PM.    [provider]  VENTOLIN HFA 108 (90 Base) MCG/ACT inhaler Inhale 2 puffs into the lungs every 4 (four) hours as needed for wheezing or shortness of breath. 10/01/18   Johnna Acosta, NP  zolpidem (AMBIEN) 10 MG tablet Take 10 mg by mouth at bedtime.    [provider]  Allergies    Citalopram  Review of Systems   Review of Systems  Cardiovascular: Positive for chest pain.  All other systems reviewed and are negative.   Physical Exam Updated Vital Signs BP (!) 147/78   Pulse 67   Temp 98.8 F (37.1 C) (Oral)   Resp 18   Ht 5\' 5"  (1.651 m)   Wt 93 kg   SpO2 99%   BMI 34.12 kg/m   Physical Exam Vitals and nursing note reviewed.  Constitutional:      Appearance: She is well-developed.  HENT:     Head: Normocephalic.  Eyes:     Pupils: Pupils are equal, round, and reactive to light.  Cardiovascular:     Rate and Rhythm: Normal rate and regular rhythm.     Heart sounds: Normal heart sounds.  Pulmonary:     Effort: Pulmonary effort is normal.     Breath sounds: Normal breath sounds.   Abdominal:     General: Bowel sounds are normal.     Palpations: Abdomen is soft.  Musculoskeletal:        General: Normal range of motion.     Cervical back: Normal range of motion.  Skin:    General: Skin is warm.     Capillary Refill: Capillary refill takes less than 2 seconds.  Neurological:     General: No focal deficit present.     Mental Status: She is alert.     Comments: CN 2- 12 intact, no facial droop, nl strength and sensation throughout   Psychiatric:        Mood and Affect: Mood normal.     ED Results / Procedures / Treatments   Labs (all labs ordered are listed, but only abnormal results are displayed) Labs Reviewed  BASIC METABOLIC PANEL - Abnormal; Notable for the following components:      Result Value   Glucose, Bld 128 (*)    All other components within normal limits  CBC  TROPONIN I (HIGH SENSITIVITY)  TROPONIN I (HIGH SENSITIVITY)    EKG EKG Interpretation  Date/Time:  Sunday April 25 2019 15:10:57 EST Ventricular Rate:  82 PR Interval:  174 QRS Duration: 74 QT Interval:  346 QTC Calculation: 404 R Axis:   41 Text Interpretation: Normal sinus rhythm Low voltage QRS Borderline ECG When compared with ECG of 30-Mar-2017 13:38, No significant change was found No significant change since last tracing Confirmed by Wandra Arthurs [phone]) on 04/25/2019 5:29:15 PM   Radiology DG Chest 2 View  Result Date: 04/25/2019 CLINICAL DATA:  Chest pain EXAM: CHEST - 2 VIEW COMPARISON:  03/30/2017 FINDINGS: The heart size and mediastinal contours are within normal limits. Both lungs are clear. Disc degenerative disease of the thoracic spine. IMPRESSION: No acute abnormality of the lungs. Electronically Signed   By: Eddie Candle M.D.   On: 04/25/2019 16:01    Procedures Procedures (including critical care time)  Medications Ordered in ED Medications  sodium chloride 0.9 % bolus 1,000 mL (has no administration in time range)    ED Course  I have reviewed  the triage vital signs and the nursing notes.  Pertinent labs & imaging results that were available during my care of the patient were reviewed by me and considered in my medical decision making (see chart for details).    MDM Rules/Calculators/A&P                      Doyle Askew is  a 64 y.o. female here presenting with shortness of breath, near syncope and left-sided chest pain.  Consider ACS versus symptomatic hypertension versus anxiety.  Also consider PE as well.  Will get labs and troponin x2 and CTA chest.  7:57 PM Labs unremarkable. Trop neg x 2. CTA showed no PE, stable sarcoidosis.  I offered steroids but she refused.  We will have her follow-up with her doctor.   Final Clinical Impression(s) / ED Diagnoses Final diagnoses:  None    Rx / DC Orders ED Discharge Orders    None       Charlynne Pander, MD 04/25/19 1958

## 2019-04-25 NOTE — ED Notes (Signed)
Pt to ct via wc at this time.

## 2019-04-25 NOTE — ED Notes (Signed)
Pt back from CT via gurney without incident.  Placed back on bedside cardiac monitor.  Will continue to monitor/reassess.

## 2019-07-02 ENCOUNTER — Emergency Department: Payer: 59

## 2019-07-02 ENCOUNTER — Emergency Department
Admission: EM | Admit: 2019-07-02 | Discharge: 2019-07-02 | Disposition: A | Discharge status: Home / Self Care | Payer: 59 | Attending: Emergency Medicine | Admitting: Emergency Medicine

## 2019-07-02 ENCOUNTER — Encounter: Payer: Self-pay | Admitting: Emergency Medicine

## 2019-07-02 ENCOUNTER — Other Ambulatory Visit: Payer: Self-pay

## 2019-07-02 DIAGNOSIS — R1013 Epigastric pain: Secondary | ICD-10-CM | POA: Diagnosis not present

## 2019-07-02 DIAGNOSIS — R1010 Upper abdominal pain, unspecified: Secondary | ICD-10-CM | POA: Insufficient documentation

## 2019-07-02 DIAGNOSIS — Z79899 Other long term (current) drug therapy: Secondary | ICD-10-CM | POA: Diagnosis not present

## 2019-07-02 DIAGNOSIS — Z7982 Long term (current) use of aspirin: Secondary | ICD-10-CM | POA: Insufficient documentation

## 2019-07-02 LAB — CBC WITH DIFFERENTIAL/PLATELET
Abs Immature Granulocytes: 0.01 10*3/uL (ref 0.00–0.07)
Basophils Absolute: 0 10*3/uL (ref 0.0–0.1)
Basophils Relative: 1 %
Eosinophils Absolute: 0.2 10*3/uL (ref 0.0–0.5)
Eosinophils Relative: 3 %
HCT: 37 % (ref 36.0–46.0)
Hemoglobin: 12.6 g/dL (ref 12.0–15.0)
Immature Granulocytes: 0 %
Lymphocytes Relative: 58 %
Lymphs Abs: 4.5 10*3/uL — ABNORMAL HIGH (ref 0.7–4.0)
MCH: 29.8 pg (ref 26.0–34.0)
MCHC: 34.1 g/dL (ref 30.0–36.0)
MCV: 87.5 fL (ref 80.0–100.0)
Monocytes Absolute: 0.8 10*3/uL (ref 0.1–1.0)
Monocytes Relative: 10 %
Neutro Abs: 2.1 10*3/uL (ref 1.7–7.7)
Neutrophils Relative %: 28 %
Platelets: 195 10*3/uL (ref 150–400)
RBC: 4.23 MIL/uL (ref 3.87–5.11)
RDW: 12.7 % (ref 11.5–15.5)
WBC: 7.6 10*3/uL (ref 4.0–10.5)
nRBC: 0 % (ref 0.0–0.2)

## 2019-07-02 LAB — URINALYSIS, COMPLETE (UACMP) WITH MICROSCOPIC
Bilirubin Urine: NEGATIVE
Glucose, UA: NEGATIVE mg/dL
Hgb urine dipstick: NEGATIVE
Ketones, ur: NEGATIVE mg/dL
Leukocytes,Ua: NEGATIVE
Nitrite: NEGATIVE
Protein, ur: NEGATIVE mg/dL
Specific Gravity, Urine: 1.011 (ref 1.005–1.030)
pH: 6 (ref 5.0–8.0)

## 2019-07-02 LAB — COMPREHENSIVE METABOLIC PANEL
ALT: 25 U/L (ref 0–44)
AST: 31 U/L (ref 15–41)
Albumin: 3.9 g/dL (ref 3.5–5.0)
Alkaline Phosphatase: 62 U/L (ref 38–126)
Anion gap: 7 (ref 5–15)
BUN: 22 mg/dL (ref 8–23)
CO2: 27 mmol/L (ref 22–32)
Calcium: 9.2 mg/dL (ref 8.9–10.3)
Chloride: 107 mmol/L (ref 98–111)
Creatinine, Ser: 1.06 mg/dL — ABNORMAL HIGH (ref 0.44–1.00)
GFR calc Af Amer: >60 mL/min (ref >60)
GFR calc non Af Amer: 57 mL/min — ABNORMAL LOW (ref >60)
Glucose, Bld: 130 mg/dL — ABNORMAL HIGH (ref 70–99)
Potassium: 3.7 mmol/L (ref 3.5–5.1)
Sodium: 141 mmol/L (ref 135–145)
Total Bilirubin: 0.6 mg/dL (ref 0.3–1.2)
Total Protein: 7.4 g/dL (ref 6.5–8.1)

## 2019-07-02 LAB — LIPASE, BLOOD: Lipase: 21 U/L (ref 11–51)

## 2019-07-02 LAB — TROPONIN I (HIGH SENSITIVITY)
Troponin I (High Sensitivity): 4 ng/L (ref <18)
Troponin I (High Sensitivity): 4 ng/L (ref <18)

## 2019-07-02 MED ORDER — LIDOCAINE VISCOUS HCL 2 % MT SOLN
15.0000 mL | Freq: Once | OROMUCOSAL | Status: AC
  Administered 2019-07-02: 08:00:00 15 mL via ORAL
  Filled 2019-07-02: qty 15

## 2019-07-02 MED ORDER — ALUM & MAG HYDROXIDE-SIMETH 200-200-20 MG/5ML PO SUSP
30.0000 mL | Freq: Once | ORAL | Status: AC
  Administered 2019-07-02: 08:00:00 30 mL via ORAL
  Filled 2019-07-02: qty 30

## 2019-07-02 NOTE — ED Notes (Signed)
Patient transported to X-ray 

## 2019-07-02 NOTE — ED Provider Notes (Signed)
[facility] Emergency Department Provider Note  ____________________________________________   First MD Initiated Contact with Patient 07/02/19 [phone]     (approximate)  I have reviewed the triage vital signs and the nursing notes.   HISTORY  Chief Complaint Abdominal Pain    HPI Valerie Yu is a 64 y.o. female his medical history as listed below includes anxiety, history of panic attacks, sarcoidosis.  She presents tonight for evaluation of some upper middle abdominal or epigastric pain versus lower chest pain.  She is pretty sure that is in her upper abdomen but is right below the ribs so she is not certain.  She points out a spot in the upper middle of the abdomen (the xiphoid process) this area is particularly sore.  She denies nausea and vomiting.  She denies fever/chills, chest pain (possibly as described above), shortness of breath, cough, lower abdominal pain, dysuria, and diarrhea.  She was concerned about the pain because of her sarcoidosis but she says that she has been feeling better since coming to the emergency department.  Nothing particular made the symptoms better or worse.  She has no history of gallstones, ulcers, blood clots in the legs of the lungs, nor heart disease.  She was seen for chest pain a couple months ago in the emergency department and had a CTA chest as well as other work-up that was generally reassuring.         Past Medical History:  Diagnosis Date  . Anxiety   . GERD (gastroesophageal reflux disease)   . History of panic attacks   . Sarcoidosis   . Shortness of breath dyspnea   . Sleep apnea     Patient Active Problem List   Diagnosis Date Noted  . Sarcoidosis   . Mediastinal adenopathy 12/21/2014    Past Surgical History:  Procedure Laterality Date  . ENDOBRONCHIAL ULTRASOUND N/A 12/21/2014   Procedure: ENDOBRONCHIAL ULTRASOUND;  Surgeon: Erin Fulling, MD;  Location: [facility] ORS;  Service: Cardiopulmonary;   Laterality: N/A;    Prior to Admission medications   Medication Sig Start Date End Date Taking? Authorizing Provider  aspirin 81 MG chewable tablet Chew 81 mg by mouth daily.    [provider]  busPIRone (BUSPAR) 5 MG tablet  05/15/17   [provider]  fluticasone (FLONASE) 50 MCG/ACT nasal spray Place 2 sprays into both nostrils as needed for allergies or rhinitis.    [provider]  loratadine (CLARITIN) 10 MG tablet Take 10 mg by mouth daily.    [provider]  meclizine (ANTIVERT) 25 MG tablet TAKE ONE HALF TO 1 TABLET BY MOUTH EVERY 6 HOURS AS NEEDED FOR DIZZINESS. WATCH FOR SEDATION. 06/09/18   [provider]  Multiple Vitamin (MULTIVITAMIN WITH MINERALS) TABS tablet Take 1 tablet by mouth daily.    [provider]  NON FORMULARY cpap device    [provider]  ondansetron (ZOFRAN ODT) 4 MG disintegrating tablet Take 1 tablet (4 mg total) by mouth every 8 (eight) hours as needed for nausea or vomiting. 03/30/17   Jene Every, MD  simvastatin (ZOCOR) 40 MG tablet Take 40 mg by mouth daily at 6 PM.    [provider]  VENTOLIN HFA 108 (90 Base) MCG/ACT inhaler Inhale 2 puffs into the lungs every 4 (four) hours as needed for wheezing or shortness of breath. 10/01/18   Johnna Acosta, NP  zolpidem (AMBIEN) 10 MG tablet Take 10 mg by mouth at bedtime.  [provider]    Allergies Citalopram  Family History  Problem Relation Age of Onset  . Diabetes Mother   . CVA Mother   . Hypertension Mother   . Breast cancer Neg Hx     Social History Social History   Tobacco Use  . Smoking status: Never Smoker  . Smokeless tobacco: Never Used  Substance Use Topics  . Alcohol use: No  . Drug use: No    Review of Systems Constitutional: No fever/chills Eyes: No visual changes. ENT: No sore throat. Cardiovascular: Denies chest pain. Respiratory: Denies shortness of breath. Gastrointestinal:  Epigastric abdominal pain.  No nausea, no vomiting.  No diarrhea.  No constipation. Genitourinary: Negative for dysuria. Musculoskeletal: Negative for neck pain.  Negative for back pain. Integumentary: Negative for rash. Neurological: Negative for headaches, focal weakness or numbness.   ____________________________________________   PHYSICAL EXAM:  VITAL SIGNS: ED Triage Vitals  Enc Vitals Group     BP 07/02/19 0219 (!) 146/79     Pulse Rate 07/02/19 0219 76     Resp 07/02/19 0219 18     Temp 07/02/19 0219 97.8 F (36.6 C)     Temp Source 07/02/19 0219 Oral     SpO2 07/02/19 0219 98 %     Weight 07/02/19 0217 92.5 kg (204 lb)     Height 07/02/19 0217 1.651 m (5\' 5" )     Head Circumference --      Peak Flow --      Pain Score 07/02/19 0217 6     Pain Loc --      Pain Edu? --      Excl. in GC? --     Constitutional: Alert and oriented.  Well-appearing and in no acute distress. Eyes: Conjunctivae are normal.  Head: Atraumatic. Nose: No congestion/rhinnorhea. Mouth/Throat: Patient is wearing a mask. Neck: No stridor.  No meningeal signs.   Cardiovascular: Normal rate, regular rhythm. Good peripheral circulation. Grossly normal heart sounds. Respiratory: Normal respiratory effort.  No retractions. Gastrointestinal: Soft and nondistended.  Mild to moderate tenderness palpation of the epigastrium.  Negative Murphy sign and no lower abdominal tenderness to palpation. Musculoskeletal: Tenderness palpation of the inferior part of the xiphoid process.  No lower extremity tenderness nor edema. No gross deformities of extremities. Neurologic:  Normal speech and language. No gross focal neurologic deficits are appreciated.  Skin:  Skin is warm, dry and intact. Psychiatric: Mood and affect are normal. Speech and behavior are normal.  ____________________________________________   LABS (all labs ordered are listed, but only abnormal results are displayed)  Labs Reviewed  CBC WITH  DIFFERENTIAL/PLATELET - Abnormal; Notable for the following components:      Result Value   Lymphs Abs 4.5 (*)    All other components within normal limits  COMPREHENSIVE METABOLIC PANEL - Abnormal; Notable for the following components:   Glucose, Bld 130 (*)    Creatinine, Ser 1.06 (*)    GFR calc non Af Amer 57 (*)    All other components within normal limits  URINALYSIS, COMPLETE (UACMP) WITH MICROSCOPIC - Abnormal; Notable for the following components:   Color, Urine YELLOW (*)    APPearance CLEAR (*)    Bacteria, UA RARE (*)    All other components within normal limits  LIPASE, BLOOD  TROPONIN I (HIGH SENSITIVITY)  TROPONIN I (HIGH SENSITIVITY)   ____________________________________________  EKG  ED ECG REPORT I, 09/01/19, the attending physician, personally viewed and interpreted this ECG.  Date: 07/02/2019  EKG Time: 2:16 Rate: 75 Rhythm: normal sinus rhythm QRS Axis: normal Intervals: normal ST/T Wave abnormalities: Non-specific ST segment / T-wave changes, but no clear evidence of acute ischemia. Narrative Interpretation: no definitive evidence of acute ischemia; does not meet STEMI criteria.   ____________________________________________  RADIOLOGY I, Hinda Kehr, personally viewed and evaluated these images (plain radiographs) as part of my medical decision making, as well as reviewing the written report by the radiologist.  ED MD interpretation: Ultrasound of the right upper quadrant is pending at the time of transfer of care to Dr. Kerman Passey.  Chest x-ray is within normal limits.  Official radiology report(s): DG Chest 2 View  Result Date: 07/02/2019 CLINICAL DATA:  Intermittent epigastric pain and shortness of breath. Personal history of sarcoidosis. EXAM: CHEST - 2 VIEW COMPARISON:  Two-view chest x-ray of the chest 04/25/2019. CT 04/25/2019 FINDINGS: Heart size is normal. Perihilar fullness is compatible with known adenopathy. Lungs are clear. No  edema or effusions are present. No focal airspace disease is present. Stable degenerative changes and exaggerated kyphosis are noted in the thoracic spine. IMPRESSION: No acute cardiopulmonary disease or significant interval change. Stable perihilar fullness compatible with chronic adenopathy of sarcoidosis. Electronically Signed   By: San Morelle M.D.   On: 07/02/2019 07:24     ____________________________________________   PROCEDURES   Procedure(s) performed (including Critical Care):  Procedures   ____________________________________________   INITIAL IMPRESSION / MDM / [place] / ED COURSE  As part of my medical decision making, I reviewed the following data within the [place] notes reviewed and incorporated, Labs reviewed , EKG interpreted , Old chart reviewed, Patient signed out to Dr. Kerman Passey, Radiograph reviewed  and Notes from prior ED visits   Differential diagnosis includes, but is not limited to, musculoskeletal pain, biliary colic, pancreatitis, acid reflux, gastric ulcer, ACS.  Patient is well-appearing and in no distress.  Stable vital signs other than hypertension.  Normal urinalysis, lipase, comprehensive metabolic panel, high-sensitivity troponin, and CBC.  Given the location of the pain I suggested we obtain a right upper quadrant ultrasound to rule out biliary colic but I believe the symptoms are more likely musculoskeletal particularly given the reproducible nature of the pain to the xiphoid process.  She understands and agrees with plan for discharge if the ultrasound is reassuring.  Transferring ED care to Dr. Agustin Cree to follow-up on ultrasound and disposition appropriately.          ____________________________________________  FINAL CLINICAL IMPRESSION(S) / ED DIAGNOSES  Final diagnoses:  Pain of upper abdomen     MEDICATIONS GIVEN DURING THIS VISIT:  Medications  alum & mag hydroxide-simeth  (MAALOX/MYLANTA) 200-200-20 MG/5ML suspension 30 mL (30 mLs Oral Given 07/02/19 4709)    And  lidocaine (XYLOCAINE) 2 % viscous mouth solution 15 mL (15 mLs Oral Given 07/02/19 6283)     ED Discharge Orders    None      *Please note:  Valerie Yu was evaluated in Emergency Department on 07/02/2019 for the symptoms described in the history of present illness. She was evaluated in the context of the global COVID-19 pandemic, which necessitated consideration that the patient might be at risk for infection with the SARS-CoV-2 virus that causes COVID-19. Institutional protocols and algorithms that pertain to the evaluation of patients at risk for COVID-19 are in a state of rapid change based on information released by regulatory bodies including the CDC and federal and state organizations. These policies and algorithms  were followed during the patient's care in the ED.  Some ED evaluations and interventions may be delayed as a result of limited staffing during the pandemic.*  Note:  This document was prepared using Dragon voice recognition software and may include unintentional dictation errors.   Loleta Rose, MD 07/02/19 [phone]

## 2019-07-02 NOTE — ED Provider Notes (Signed)
-----------------------------------------   8:10 AM on 07/02/2019 -----------------------------------------  Patient care assumed from Dr. York Cerise.  Patient's ultrasound is reassuring.  Lab work shows no concerning findings.  We will discharge the patient home with PCP follow-up.  Patient agreeable to plan of care.   Minna Antis, MD 07/02/19 [phone]

## 2019-07-02 NOTE — ED Triage Notes (Signed)
Patient ambulatory to triage with steady gait, without difficulty or distress noted, mask in place; pt c/o mid epigastric pain since 10pm, nonradiating accomp by [facility]; denies hx of same

## 2019-08-13 DIAGNOSIS — E782 Mixed hyperlipidemia: Secondary | ICD-10-CM | POA: Insufficient documentation

## 2019-09-22 DIAGNOSIS — R0789 Other chest pain: Secondary | ICD-10-CM | POA: Insufficient documentation

## 2019-09-22 DIAGNOSIS — I10 Essential (primary) hypertension: Secondary | ICD-10-CM | POA: Insufficient documentation

## 2019-09-23 ENCOUNTER — Other Ambulatory Visit: Payer: Self-pay | Admitting: Neurology

## 2019-09-23 DIAGNOSIS — R202 Paresthesia of skin: Secondary | ICD-10-CM

## 2019-09-23 DIAGNOSIS — R209 Unspecified disturbances of skin sensation: Secondary | ICD-10-CM

## 2019-10-07 ENCOUNTER — Ambulatory Visit: Payer: No Typology Code available for payment source | Admitting: Internal Medicine

## 2019-10-07 ENCOUNTER — Encounter: Payer: Self-pay | Admitting: Internal Medicine

## 2019-10-07 ENCOUNTER — Other Ambulatory Visit: Payer: Self-pay

## 2019-10-07 VITALS — BP 150/86 | HR 67 | Temp 97.1°F | Resp 16 | Ht 65.0 in | Wt 205.8 lb

## 2019-10-07 DIAGNOSIS — R079 Chest pain, unspecified: Secondary | ICD-10-CM | POA: Diagnosis not present

## 2019-10-07 DIAGNOSIS — D869 Sarcoidosis, unspecified: Secondary | ICD-10-CM | POA: Diagnosis not present

## 2019-10-07 DIAGNOSIS — R0602 Shortness of breath: Secondary | ICD-10-CM

## 2019-10-07 MED ORDER — NAPROXEN 500 MG PO TABS: 500.0000 mg | ORAL_TABLET | Freq: Two times a day (BID) | ORAL | 2 refills | Status: DC

## 2019-10-07 NOTE — Progress Notes (Signed)
[facility] [address] [place], [place] [address]  Pulmonary Sleep Medicine   Office Visit Note  Patient Name: Valerie Yu DOB: 01/07/1956 MRN [id number]  Date of Service: 10/07/2019  Complaints/HPI: Pt is here for follow up on Sarcoidosis.  Overall she is at her baseline and is doing well with her breathing.  She continues to have some quick intermittent pains.  She is using her albuterol inhaler twice a week. She has good results with naproxen, for pain control.   ROS  General: (-) fever, (-) chills, (-) night sweats, (-) weakness Skin: (-) rashes, (-) itching,. Eyes: (-) visual changes, (-) redness, (-) itching. Nose and Sinuses: (-) nasal stuffiness or itchiness, (-) postnasal drip, (-) nosebleeds, (-) sinus trouble. Mouth and Throat: (-) sore throat, (-) hoarseness. Neck: (-) swollen glands, (-) enlarged thyroid, (-) neck pain. Respiratory: - cough, (-) bloody sputum, - shortness of breath, - wheezing. Cardiovascular: - ankle swelling, (-) chest pain. Lymphatic: (-) lymph node enlargement. Neurologic: (-) numbness, (-) tingling. Psychiatric: (-) anxiety, (-) depression   Current Medication: Outpatient Encounter Medications as of 10/07/2019  Medication Sig  . aspirin 81 MG chewable tablet Chew 81 mg by mouth daily.  . busPIRone (BUSPAR) 5 MG tablet   . fluticasone (FLONASE) 50 MCG/ACT nasal spray Place 2 sprays into both nostrils as needed for allergies or rhinitis.  Marland Kitchen loratadine (CLARITIN) 10 MG tablet Take 10 mg by mouth daily.  . NON FORMULARY cpap device  . ondansetron (ZOFRAN ODT) 4 MG disintegrating tablet Take 1 tablet (4 mg total) by mouth every 8 (eight) hours as needed for nausea or vomiting.  . simvastatin (ZOCOR) 40 MG tablet Take 40 mg by mouth daily at 6 PM.  . VENTOLIN HFA 108 (90 Base) MCG/ACT inhaler Inhale 2 puffs into the lungs every 4 (four) hours as needed for wheezing or shortness of breath.  . zolpidem (AMBIEN) 10 MG tablet Take  10 mg by mouth at bedtime.  . meclizine (ANTIVERT) 25 MG tablet TAKE ONE HALF TO 1 TABLET BY MOUTH EVERY 6 HOURS AS NEEDED FOR DIZZINESS. WATCH FOR SEDATION. (Patient not taking: Reported on 10/07/2019)  . Multiple Vitamin (MULTIVITAMIN WITH MINERALS) TABS tablet Take 1 tablet by mouth daily. (Patient not taking: Reported on 10/07/2019)   No facility-administered encounter medications on file as of 10/07/2019.    Surgical History: Past Surgical History:  Procedure Laterality Date  . ENDOBRONCHIAL ULTRASOUND N/A 12/21/2014   Procedure: ENDOBRONCHIAL ULTRASOUND;  Surgeon: Erin Fulling, MD;  Location: [facility] ORS;  Service: Cardiopulmonary;  Laterality: N/A;    Medical History: Past Medical History:  Diagnosis Date  . Anxiety   . GERD (gastroesophageal reflux disease)   . History of panic attacks   . Sarcoidosis   . Shortness of breath dyspnea   . Sleep apnea     Family History: Family History  Problem Relation Age of Onset  . Diabetes Mother   . CVA Mother   . Hypertension Mother   . Breast cancer Neg Hx     Social History: Social History   Socioeconomic History  . Marital status: Married    Spouse name: Not on file  . Number of children: Not on file  . Years of education: Not on file  . Highest education level: Not on file  Occupational History  . Not on file  Tobacco Use  . Smoking status: Never Smoker  . Smokeless tobacco: Never Used  Vaping Use  . Vaping Use: Never used  Substance and Sexual Activity  . Alcohol use: No  . Drug use: No  . Sexual activity: Not on file  Other Topics Concern  . Not on file  Social History Narrative  . Not on file   Social Determinants of Health   Financial Resource Strain:   . Difficulty of Paying Living Expenses:   Food Insecurity:   . Worried About Programme researcher, broadcasting/film/video in the Last Year:   . Barista in the Last Year:   Transportation Needs:   . Freight forwarder (Medical):   Marland Kitchen Lack of Transportation  (Non-Medical):   Physical Activity:   . Days of Exercise per Week:   . Minutes of Exercise per Session:   Stress:   . Feeling of Stress :   Social Connections:   . Frequency of Communication with Friends and Family:   . Frequency of Social Gatherings with Friends and Family:   . Attends Religious Services:   . Active Member of Clubs or Organizations:   . Attends Banker Meetings:   Marland Kitchen Marital Status:   Intimate Partner Violence:   . Fear of Current or Ex-Partner:   . Emotionally Abused:   Marland Kitchen Physically Abused:   . Sexually Abused:     Vital Signs: Blood pressure (!) 150/86, pulse 67, temperature (!) 97.1 F (36.2 C), resp. rate 16, height 5\' 5"  (1.651 m), weight 205 lb 12.8 oz (93.4 kg), SpO2 98 %.  Examination: General Appearance: The patient is well-developed, well-nourished, and in no distress. Skin: Gross inspection of skin unremarkable. Head: normocephalic, no gross deformities. Eyes: no gross deformities noted. ENT: ears appear grossly normal no exudates. Neck: Supple. No thyromegaly. No LAD. Respiratory: clear bilaterally. Cardiovascular: Normal S1 and S2 without murmur or rub. Extremities: No cyanosis. pulses are equal. Neurologic: Alert and oriented. No involuntary movements.  LABS: No results found for this or any previous visit (from the past 2160 hour(s)).  Radiology: DG Chest 2 View  Result Date: 07/02/2019 CLINICAL DATA:  Intermittent epigastric pain and shortness of breath. Personal history of sarcoidosis. EXAM: CHEST - 2 VIEW COMPARISON:  Two-view chest x-ray of the chest 04/25/2019. CT 04/25/2019 FINDINGS: Heart size is normal. Perihilar fullness is compatible with known adenopathy. Lungs are clear. No edema or effusions are present. No focal airspace disease is present. Stable degenerative changes and exaggerated kyphosis are noted in the thoracic spine. IMPRESSION: No acute cardiopulmonary disease or significant interval change. Stable perihilar  fullness compatible with chronic adenopathy of sarcoidosis. Electronically Signed   By: 04/27/2019 M.D.   On: 07/02/2019 07:24   09/01/2019 ABDOMEN LIMITED RUQ  Result Date: 07/02/2019 CLINICAL DATA:  Upper abdominal versus lower chest pain. Additional provided by scanning technologist: Epigastric pain since last night. EXAM: ULTRASOUND ABDOMEN LIMITED RIGHT UPPER QUADRANT COMPARISON:  No pertinent prior studies available for comparison. FINDINGS: Gallbladder: No gallstones or wall thickening visualized. No sonographic Murphy sign noted by sonographer. Common bile duct: Diameter: 3 mm, within normal limits Liver: No focal lesion identified. Generalized increased hepatic parenchymal echogenicity. Portal vein is patent on color Doppler imaging with normal direction of blood flow towards the liver. IMPRESSION: Hyperechogenicity of the hepatic parenchyma. This is a nonspecific finding, which may be seen in the setting of hepatic steatosis or other chronic hepatic parenchymal disease. Otherwise unremarkable right upper quadrant ultrasound, as described. Electronically Signed   By: 09/01/2019 DO   On: 07/02/2019 07:58    No results found.  No  results found.    Assessment and Plan: Patient Active Problem List   Diagnosis Date Noted  . Sarcoidosis   . Mediastinal adenopathy 12/21/2014    1. Sarcoidosis Pt will continue to with supportive care at this time.  Discussed follow up CT, pt would like to wait 6 more months at this time.  She appears stable.   2. Chest pain, unspecified type Use naprosyn as needed. - naproxen (NAPROSYN) 500 MG tablet; Take 1 tablet (500 mg total) by mouth 2 (two) times daily with a meal.  Dispense: 60 tablet; Refill: 2  3. SOB (shortness of breath) - Spirometry with Graph  General Counseling: I have discussed the findings of the evaluation and examination with Kevin Fenton.  I have also discussed any further diagnostic evaluation thatmay be needed or ordered today. Yoshiye  verbalizes understanding of the findings of todays visit. We also reviewed her medications today and discussed drug interactions and side effects including but not limited excessive drowsiness and altered mental states. We also discussed that there is always a risk not just to her but also people around her. she has been encouraged to call the office with any questions or concerns that should arise related to todays visit.  Orders Placed This Encounter  Procedures  . Spirometry with Graph    Order Specific Question:   Where should this test be performed?    Answer:   [facility]    Order Specific Question:   Basic spirometry    Answer:   Yes     Time spent: 30 This patient was seen by Blima Ledger AGNP-C in Collaboration with Dr. Freda Munro as a part of collaborative care agreement.   I have personally obtained a history, examined the patient, evaluated laboratory and imaging results, formulated the assessment and plan and placed orders.    Yevonne Pax, MD [facility] Pulmonary and Critical Care Sleep medicine

## 2019-10-09 ENCOUNTER — Ambulatory Visit
Admission: RE | Admit: 2019-10-09 | Discharge: 2019-10-09 | Disposition: A | Discharge status: Home / Self Care | Payer: No Typology Code available for payment source | Source: Ambulatory Visit | Attending: Neurology | Admitting: Neurology

## 2019-10-09 ENCOUNTER — Other Ambulatory Visit: Payer: Self-pay

## 2019-10-09 DIAGNOSIS — R209 Unspecified disturbances of skin sensation: Secondary | ICD-10-CM | POA: Diagnosis present

## 2019-10-09 DIAGNOSIS — R202 Paresthesia of skin: Secondary | ICD-10-CM | POA: Insufficient documentation

## 2019-10-09 DIAGNOSIS — R200 Anesthesia of skin: Secondary | ICD-10-CM | POA: Diagnosis not present

## 2019-10-09 MED ORDER — GADOBUTROL 1 MMOL/ML IV SOLN
9.0000 mL | Freq: Once | INTRAVENOUS | Status: AC | PRN
  Administered 2019-10-09: 9 mL via INTRAVENOUS

## 2019-10-16 ENCOUNTER — Emergency Department: Payer: No Typology Code available for payment source

## 2019-10-16 ENCOUNTER — Emergency Department
Admission: EM | Admit: 2019-10-16 | Discharge: 2019-10-16 | Disposition: A | Discharge status: Home / Self Care | Payer: No Typology Code available for payment source | Attending: Emergency Medicine | Admitting: Emergency Medicine

## 2019-10-16 ENCOUNTER — Other Ambulatory Visit: Payer: Self-pay

## 2019-10-16 DIAGNOSIS — M25561 Pain in right knee: Secondary | ICD-10-CM | POA: Insufficient documentation

## 2019-10-16 DIAGNOSIS — Y9289 Other specified places as the place of occurrence of the external cause: Secondary | ICD-10-CM | POA: Insufficient documentation

## 2019-10-16 DIAGNOSIS — Z7982 Long term (current) use of aspirin: Secondary | ICD-10-CM | POA: Diagnosis not present

## 2019-10-16 DIAGNOSIS — W19XXXA Unspecified fall, initial encounter: Secondary | ICD-10-CM | POA: Insufficient documentation

## 2019-10-16 DIAGNOSIS — Z79899 Other long term (current) drug therapy: Secondary | ICD-10-CM | POA: Diagnosis not present

## 2019-10-16 DIAGNOSIS — Y939 Activity, unspecified: Secondary | ICD-10-CM | POA: Diagnosis not present

## 2019-10-16 DIAGNOSIS — Y999 Unspecified external cause status: Secondary | ICD-10-CM | POA: Diagnosis not present

## 2019-10-16 DIAGNOSIS — M25562 Pain in left knee: Secondary | ICD-10-CM | POA: Insufficient documentation

## 2019-10-16 MED ORDER — ONDANSETRON 4 MG PO TBDP
4.0000 mg | ORAL_TABLET | Freq: Once | ORAL | Status: AC
  Administered 2019-10-16: 4 mg via ORAL
  Filled 2019-10-16: qty 1

## 2019-10-16 MED ORDER — HYDROCODONE-ACETAMINOPHEN 5-325 MG PO TABS
1.0000 | ORAL_TABLET | Freq: Once | ORAL | Status: AC
  Administered 2019-10-16: 1 via ORAL
  Filled 2019-10-16: qty 1

## 2019-10-16 NOTE — ED Triage Notes (Signed)
Pt states she fell at [employer] injuring bilateral knees. Pt with more pain in left knee than right. No obvious deformity noted. Pt is holding knee straight out.

## 2019-10-16 NOTE — ED Notes (Signed)
Pt states she was at a restaurant and fell on both knees, but mostly her left. Pt states pain and limited movement to left leg

## 2019-10-16 NOTE — ED Triage Notes (Signed)
First Nurse: patient brought in by ems from [facility]. Patient fell. Patient with complaint of left knee pain.

## 2019-10-16 NOTE — Discharge Instructions (Signed)
Take Naproxen for pain.

## 2019-10-16 NOTE — ED Provider Notes (Signed)
Emergency Department Provider Note  ____________________________________________  Time seen: Approximately 10:46 PM  I have reviewed the triage vital signs and the nursing notes.   HISTORY  Chief Complaint Fall   Historian Patient   HPI Valerie Yu is a 64 y.o. female presents to the emergency department after patient fell while coming out of the [place] [employer].  Patient states that she fell onto her bilateral knees.  She states that she is having more pain on the left than the right and has been able to bear weight on the right.  She denies numbness or tingling in the bilateral lower extremities.  She not hit her head or her neck.  No abrasions or lacerations.  No other alleviating measures been attempted.   Past Medical History:  Diagnosis Date  . Anxiety   . GERD (gastroesophageal reflux disease)   . History of panic attacks   . Sarcoidosis   . Shortness of breath dyspnea   . Sleep apnea      Immunizations up to date:  Yes.     Past Medical History:  Diagnosis Date  . Anxiety   . GERD (gastroesophageal reflux disease)   . History of panic attacks   . Sarcoidosis   . Shortness of breath dyspnea   . Sleep apnea     Patient Active Problem List   Diagnosis Date Noted  . Sarcoidosis   . Mediastinal adenopathy 12/21/2014    Past Surgical History:  Procedure Laterality Date  . ENDOBRONCHIAL ULTRASOUND N/A 12/21/2014   Procedure: ENDOBRONCHIAL ULTRASOUND;  Surgeon: Erin Fulling, MD;  Location: [facility] ORS;  Service: Cardiopulmonary;  Laterality: N/A;    Prior to Admission medications   Medication Sig Start Date End Date Taking? Authorizing Provider  aspirin 81 MG chewable tablet Chew 81 mg by mouth daily.    [provider]  busPIRone (BUSPAR) 5 MG tablet  05/15/17   [provider]  fluticasone (FLONASE) 50 MCG/ACT nasal spray Place 2 sprays into both nostrils as needed for allergies or rhinitis.    [provider]  loratadine  (CLARITIN) 10 MG tablet Take 10 mg by mouth daily.    [provider]  meclizine (ANTIVERT) 25 MG tablet TAKE ONE HALF TO 1 TABLET BY MOUTH EVERY 6 HOURS AS NEEDED FOR DIZZINESS. WATCH FOR SEDATION. Patient not taking: Reported on 10/07/2019 06/09/18   [provider]  Multiple Vitamin (MULTIVITAMIN WITH MINERALS) TABS tablet Take 1 tablet by mouth daily. Patient not taking: Reported on 10/07/2019    [provider]  naproxen (NAPROSYN) 500 MG tablet Take 1 tablet (500 mg total) by mouth 2 (two) times daily with a meal. 10/07/19   [place]ar[place], Coralee North, NP  NON FORMULARY cpap device    [provider]  ondansetron (ZOFRAN ODT) 4 MG disintegrating tablet Take 1 tablet (4 mg total) by mouth every 8 (eight) hours as needed for nausea or vomiting. 03/30/17   Jene Every, MD  simvastatin (ZOCOR) 40 MG tablet Take 40 mg by mouth daily at 6 PM.    [provider]  VENTOLIN HFA 108 (90 Base) MCG/ACT inhaler Inhale 2 puffs into the lungs every 4 (four) hours as needed for wheezing or shortness of breath. 10/01/18   Johnna Acosta, NP  zolpidem (AMBIEN) 10 MG tablet Take 10 mg by mouth at bedtime.    [provider]    Allergies Citalopram  Family History  Problem Relation Age of Onset  . Diabetes Mother   .  CVA Mother   . Hypertension Mother   . Breast cancer Neg Hx     Social History Social History   Tobacco Use  . Smoking status: Never Smoker  . Smokeless tobacco: Never Used  Vaping Use  . Vaping Use: Never used  Substance Use Topics  . Alcohol use: No  . Drug use: No     Review of Systems  Constitutional: No fever/chills Eyes:  No discharge ENT: No upper respiratory complaints. Respiratory: no cough. No SOB/ use of accessory muscles to breath Gastrointestinal:   No nausea, no vomiting.  No diarrhea.  No constipation. Musculoskeletal: Patient has bilateral knee pain.  Skin: Negative for rash, abrasions, lacerations,  ecchymosis.   ____________________________________________   PHYSICAL EXAM:  VITAL SIGNS: ED Triage Vitals [10/16/19 2047]  Enc Vitals Group     BP (!) 167/84     Pulse Rate 89     Resp 16     Temp 98.2 F (36.8 C)     Temp Source Oral     SpO2 97 %     Weight 205 lb (93 kg)     Height 5\' 5"  (1.651 m)     Head Circumference      Peak Flow      Pain Score 8     Pain Loc      Pain Edu?      Excl. in GC?      Constitutional: Alert and oriented. Well appearing and in no acute distress. Eyes: Conjunctivae are normal. PERRL. EOMI. Head: Atraumatic. Cardiovascular: Normal rate, regular rhythm. Normal S1 and S2.  Good peripheral circulation. Respiratory: Normal respiratory effort without tachypnea or retractions. Lungs CTAB. Good air entry to the bases with no decreased or absent breath sounds Gastrointestinal: Bowel sounds x 4 quadrants. Soft and nontender to palpation. No guarding or rigidity. No distention. Musculoskeletal: Full range of motion to all extremities. No obvious deformities noted.  No deficits with provocative testing of the bilateral knees.  Palpable dorsalis pedis pulse bilaterally and symmetrically. Neurologic:  Normal for age. No gross focal neurologic deficits are appreciated.  Skin:  Skin is warm, dry and intact. No rash noted. Psychiatric: Mood and affect are normal for age. Speech and behavior are normal.   ____________________________________________   LABS (all labs ordered are listed, but only abnormal results are displayed)  Labs Reviewed - No data to display ____________________________________________  EKG   ____________________________________________  RADIOLOGY , personally viewed and evaluated these images (plain radiographs) as part of my medical decision making, as well as reviewing the written report by the radiologist.  DG Knee Complete 4 Views Left  Result Date: 10/16/2019 CLINICAL DATA:  Fall with bilateral knee  pain EXAM: LEFT KNEE - COMPLETE 4+ VIEW COMPARISON:  None. FINDINGS: No sizable effusion accounting for rotation. Possible spurring versus a minimally displaced fracture of the lateral tibial spine. A tiny corticated ossification is seen along the anterior joint line, possibly joint body. Additional well corticated, likely enthesopathic ossifications are seen along the anterosuperior patella at the distal quadriceps insertion. There is however some mild thickening of the patellar tendon in the anterior soft tissue thickening though this may be contusive in nature given recent fall. IMPRESSION: 1. Possible spurring versus a minimally displaced fracture of the lateral tibial spine. 2. Mild thickening of the patellar tendon and anterior soft tissue thickening, possibly contusive given fall. 3. Tiny corticated ossification along the anterior joint line, likely remote joint body. Electronically Signed  By: Kreg Shropshire M.D.   On: 10/16/2019 21:24   DG Knee Complete 4 Views Right  Result Date: 10/16/2019 CLINICAL DATA:  Fall, bilateral knee pain EXAM: RIGHT KNEE - COMPLETE 4+ VIEW COMPARISON:  None. FINDINGS: Four view radiograph right knee demonstrates normal alignment. No fracture or dislocation. Mild medial compartment degenerative arthritis with asymmetric joint space narrowing and osteophyte formation. No effusion. Soft tissues are unremarkable. IMPRESSION: Mild medial compartment degenerative arthritis. No acute findings. Electronically Signed   By: Helyn Numbers MD   On: 10/16/2019 21:27    ____________________________________________    PROCEDURES  Procedure(s) performed:     Procedures     Medications  HYDROcodone-acetaminophen (NORCO/VICODIN) 5-325 MG per tablet 1 tablet (1 tablet Oral Given 10/16/19 2214)  ondansetron (ZOFRAN-ODT) disintegrating tablet 4 mg (4 mg Oral Given 10/16/19 2214)     ____________________________________________   INITIAL IMPRESSION / ASSESSMENT AND PLAN /  ED COURSE  Pertinent labs & imaging results that were available during my care of the patient were reviewed by me and considered in my medical decision making (see chart for details).      Assessment and plan Fall 64 year old female presents to the emergency department after a mechanical fall while eating out at the [place] [employer].  X-ray of the right knee reveals no acute bony abnormality.  Patient states that she is not having much pain of the left knee.  Patient's x-rays of the left knee were reviewed and radiologist was concerned about spurring versus possible fracture of the lateral tibial spine.  Legrand Rams spurring over acute fracture at this time given physical exam and distribution of pain.  Patient was given Norco in the emergency department and crutches were provided.  She was advised to follow-up with orthopedics as needed.  Return precautions were given to return with new or worsening symptoms.   ____________________________________________  FINAL CLINICAL IMPRESSION(S) / ED DIAGNOSES  Final diagnoses:  Fall, initial encounter      NEW MEDICATIONS STARTED DURING THIS VISIT:  ED Discharge Orders    None          This chart was dictated using voice recognition software/Dragon. Despite best efforts to proofread, errors can occur which can change the meaning. Any change was purely unintentional.     Gasper Lloyd 10/16/19 2249    Chesley Noon, MD 10/16/19 [phone]

## 2019-12-20 ENCOUNTER — Ambulatory Visit (INDEPENDENT_AMBULATORY_CARE_PROVIDER_SITE_OTHER): Payer: No Typology Code available for payment source | Admitting: Internal Medicine

## 2019-12-20 VITALS — BP 136/75 | HR 70 | Ht 64.0 in | Wt 208.0 lb

## 2019-12-20 DIAGNOSIS — F5105 Insomnia due to other mental disorder: Secondary | ICD-10-CM | POA: Diagnosis not present

## 2019-12-20 DIAGNOSIS — Z9989 Dependence on other enabling machines and devices: Secondary | ICD-10-CM | POA: Diagnosis not present

## 2019-12-20 DIAGNOSIS — G4733 Obstructive sleep apnea (adult) (pediatric): Secondary | ICD-10-CM

## 2019-12-20 DIAGNOSIS — F419 Anxiety disorder, unspecified: Secondary | ICD-10-CM | POA: Insufficient documentation

## 2019-12-20 DIAGNOSIS — Z7189 Other specified counseling: Secondary | ICD-10-CM

## 2019-12-20 NOTE — Progress Notes (Signed)
[facility] [address] [place], [place] [address]  Pulmonary Sleep Medicine   Office Visit Note  Patient Name: Valerie Yu DOB: 26-May-1955 MRN [id number]    Chief Complaint: Obstructive Sleep Apnea visit  Brief History:  Valerie Yu is seen today for follow up The patient has a 8 year history of sleep apnea. Patient is using PAP nightly.  She used to sleep well with CPAP but over the past 2 years she has been having difficulty sleeping and she is taking Ambien for sleep. She is caring for her mother and husband. She is always concerned about caring for them. She is now on anxiety medication. She gets home from work around 11: 15 p.m. and goes to bed around 1:30 a.m. and she can't sleep past 7-8 a.m.  She never falls asleep during the day although she feels tired.  The patient feels more after sleeping with PAP.  The patient reports benefiting from PAP use. Reported sleepiness is  improved and the Epworth Sleepiness Score is 0 out of 24. The patient cannot  take naps. The patient complains of the following: no complaints.  The compliance download shows excellent compliance with an average use time of 7.3 hours. The AHI is not recorded  The patient does not complain of limb movements disrupting sleep.  ROS  General: (-) fever, (-) chills, (-) night sweat Nose and Sinuses: (-) nasal stuffiness or itchiness, (-) postnasal drip, (-) nosebleeds, (-) sinus trouble. Mouth and Throat: (-) sore throat, (-) hoarseness. Neck: (-) swollen glands, (-) enlarged thyroid, (-) neck pain. Respiratory: - cough, - shortness of breath, - wheezing. Neurologic: - numbness, - tingling. Psychiatric: - anxiety, - depression   Current Medication: Outpatient Encounter Medications as of 12/20/2019  Medication Sig  . orlistat (XENICAL) 120 MG capsule Take by mouth.  Marland Kitchen aspirin 81 MG chewable tablet Chew 81 mg by mouth daily.  . busPIRone (BUSPAR) 5 MG tablet   . fluticasone (FLONASE) 50 MCG/ACT  nasal spray Place 2 sprays into both nostrils as needed for allergies or rhinitis.  Marland Kitchen meclizine (ANTIVERT) 25 MG tablet TAKE ONE HALF TO 1 TABLET BY MOUTH EVERY 6 HOURS AS NEEDED FOR DIZZINESS. WATCH FOR SEDATION. (Patient not taking: Reported on 10/07/2019)  . naproxen (NAPROSYN) 500 MG tablet Take 1 tablet (500 mg total) by mouth 2 (two) times daily with a meal.  . NON FORMULARY cpap device  . simvastatin (ZOCOR) 40 MG tablet Take 40 mg by mouth daily at 6 PM.  . VENTOLIN HFA 108 (90 Base) MCG/ACT inhaler Inhale 2 puffs into the lungs every 4 (four) hours as needed for wheezing or shortness of breath.  . zolpidem (AMBIEN) 10 MG tablet Take 10 mg by mouth at bedtime.  . [DISCONTINUED] loratadine (CLARITIN) 10 MG tablet Take 10 mg by mouth daily.  . [DISCONTINUED] Multiple Vitamin (MULTIVITAMIN WITH MINERALS) TABS tablet Take 1 tablet by mouth daily. (Patient not taking: Reported on 10/07/2019)  . [DISCONTINUED] ondansetron (ZOFRAN ODT) 4 MG disintegrating tablet Take 1 tablet (4 mg total) by mouth every 8 (eight) hours as needed for nausea or vomiting.   No facility-administered encounter medications on file as of 12/20/2019.    Surgical History: Past Surgical History:  Procedure Laterality Date  . ENDOBRONCHIAL ULTRASOUND N/A 12/21/2014   Procedure: ENDOBRONCHIAL ULTRASOUND;  Surgeon: Erin Fulling, MD;  Location: [facility] ORS;  Service: Cardiopulmonary;  Laterality: N/A;    Medical History: Past Medical History:  Diagnosis Date  . Anxiety   .  GERD (gastroesophageal reflux disease)   . History of panic attacks   . Hyposomnia, insomnia or sleeplessness associated with anxiety   . Sarcoidosis   . Shortness of breath dyspnea   . Sleep apnea     Family History: Non contributory to the present illness  Social History: Social History   Socioeconomic History  . Marital status: Married    Spouse name: Not on file  . Number of children: Not on file  . Years of education: Not on file  .  Highest education level: Not on file  Occupational History  . Not on file  Tobacco Use  . Smoking status: Never Smoker  . Smokeless tobacco: Never Used  Vaping Use  . Vaping Use: Never used  Substance and Sexual Activity  . Alcohol use: No  . Drug use: No  . Sexual activity: Not on file  Other Topics Concern  . Not on file  Social History Narrative  . Not on file   Social Determinants of Health   Financial Resource Strain:   . Difficulty of Paying Living Expenses: Not on file  Food Insecurity:   . Worried About Programme researcher, broadcasting/film/video in the Last Year: Not on file  . Ran Out of Food in the Last Year: Not on file  Transportation Needs:   . Lack of Transportation (Medical): Not on file  . Lack of Transportation (Non-Medical): Not on file  Physical Activity:   . Days of Exercise per Week: Not on file  . Minutes of Exercise per Session: Not on file  Stress:   . Feeling of Stress : Not on file  Social Connections:   . Frequency of Communication with Friends and Family: Not on file  . Frequency of Social Gatherings with Friends and Family: Not on file  . Attends Religious Services: Not on file  . Active Member of Clubs or Organizations: Not on file  . Attends Banker Meetings: Not on file  . Marital Status: Not on file  Intimate Partner Violence:   . Fear of Current or Ex-Partner: Not on file  . Emotionally Abused: Not on file  . Physically Abused: Not on file  . Sexually Abused: Not on file    Vital Signs: Blood pressure 136/75, pulse 70, height 5\' 4"  (1.626 m), weight 208 lb (94.3 kg), SpO2 98 %.  Examination: General Appearance: The patient is well-developed, well-nourished, and in no distress. Neck Circumference: 36 Skin: Gross inspection of skin unremarkable. Head: normocephalic, no gross deformities. Eyes: no gross deformities noted. ENT: ears appear grossly normal Neurologic: Alert and oriented. No involuntary movements.    EPWORTH SLEEPINESS  SCALE:  Scale:  (0)= no chance of dozing; (1)= slight chance of dozing; (2)= moderate chance of dozing; (3)= high chance of dozing  Chance  Situtation    Sitting and reading: 0    Watching TV: 0    Sitting Inactive in public: 0    As a passenger in car: 0      Lying down to rest: 0    Sitting and talking: 0    Sitting quielty after lunch: 0    In a car, stopped in traffic: 0   TOTAL SCORE:   0 out of 24    SLEEP STUDIES:  1. Split 01/2012 AHI 108 SpO52min 82%, CPAP 8 recommended   CPAP COMPLIANCE DATA:  Date Range: 12/20/18-12/19/19  Average Daily Use: 7.2 hours  Median Use: 7.3  Compliance for > 4 Hours: 97%  AHI:  Not available  Days Used: 360/365  Mask Leak: not available  95th Percentile Pressure: 11         LABS: No results found for this or any previous visit (from the past 2160 hour(s)).  Radiology: DG Knee Complete 4 Views Left  Result Date: 10/16/2019 CLINICAL DATA:  Fall with bilateral knee pain EXAM: LEFT KNEE - COMPLETE 4+ VIEW COMPARISON:  None. FINDINGS: No sizable effusion accounting for rotation. Possible spurring versus a minimally displaced fracture of the lateral tibial spine. A tiny corticated ossification is seen along the anterior joint line, possibly joint body. Additional well corticated, likely enthesopathic ossifications are seen along the anterosuperior patella at the distal quadriceps insertion. There is however some mild thickening of the patellar tendon in the anterior soft tissue thickening though this may be contusive in nature given recent fall. IMPRESSION: 1. Possible spurring versus a minimally displaced fracture of the lateral tibial spine. 2. Mild thickening of the patellar tendon and anterior soft tissue thickening, possibly contusive given fall. 3. Tiny corticated ossification along the anterior joint line, likely remote joint body. Electronically Signed   By: Kreg Shropshire M.D.   On: 10/16/2019 21:24   DG Knee  Complete 4 Views Right  Result Date: 10/16/2019 CLINICAL DATA:  Fall, bilateral knee pain EXAM: RIGHT KNEE - COMPLETE 4+ VIEW COMPARISON:  None. FINDINGS: Four view radiograph right knee demonstrates normal alignment. No fracture or dislocation. Mild medial compartment degenerative arthritis with asymmetric joint space narrowing and osteophyte formation. No effusion. Soft tissues are unremarkable. IMPRESSION: Mild medial compartment degenerative arthritis. No acute findings. Electronically Signed   By: Helyn Numbers MD   On: 10/16/2019 21:27    No results found.  No results found.    Assessment and Plan: Patient Active Problem List   Diagnosis Date Noted  . Hyposomnia, insomnia or sleeplessness associated with anxiety   . Sarcoidosis   . Mediastinal adenopathy 12/21/2014      The patient does tolerate PAP and reports significant benefit from PAP use. The patient' s machine is past end of life and must be replaced. She has significant sleep onset insomnia due to anxiety and stress related to caregiving. She was encouraged to enroll in CBTi and sleep hygiene and stimulus control recommendations were reviewed.The compliance is excellent. The apnea is not recorded on her machine.   1. OSA- continue excellent compliance.Follow up 30+ days after set up 2. Insomnia.- CBTi and sleep hygiene, stimulus control were discussed. 3. CPAP couseling-Discussed importance of adequate CPAP use as well as proper care and cleaning techniques of machine and all supplies.  General Counseling: I have discussed the findings of the evaluation and examination with Kevin Fenton.  I have also discussed any further diagnostic evaluation thatmay be needed or ordered today. Nemiah verbalizes understanding of the findings of todays visit. We also reviewed her medications today and discussed drug interactions and side effects including but not limited excessive drowsiness and altered mental states. We also discussed that there  is always a risk not just to her but also people around her. she has been encouraged to call the office with any questions or concerns that should arise related to todays visit.  This patient was seen by Layla Barter, AGNP-C in collaboration with Dr. Freda Munro as a part of collaborative care agreement.     I have personally obtained a history, examined the patient, evaluated laboratory and imaging results, formulated the assessment and plan and placed orders.  Richelle Ito Saunders Glance, PhD, FAASM  Diplomate, American Board of Sleep Medicine    Allyne Gee, MD [facility] Diplomate ABMS Pulmonary and Critical Care Medicine Sleep medicine

## 2019-12-20 NOTE — Patient Instructions (Signed)

## 2020-01-24 ENCOUNTER — Ambulatory Visit (INDEPENDENT_AMBULATORY_CARE_PROVIDER_SITE_OTHER): Payer: No Typology Code available for payment source | Admitting: Internal Medicine

## 2020-01-24 VITALS — BP 132/86 | HR 71 | Temp 97.5°F | Resp 16 | Ht 64.0 in | Wt 204.0 lb

## 2020-01-24 DIAGNOSIS — Z7189 Other specified counseling: Secondary | ICD-10-CM | POA: Diagnosis not present

## 2020-01-24 DIAGNOSIS — E669 Obesity, unspecified: Secondary | ICD-10-CM | POA: Insufficient documentation

## 2020-01-24 DIAGNOSIS — G4733 Obstructive sleep apnea (adult) (pediatric): Secondary | ICD-10-CM

## 2020-01-24 DIAGNOSIS — F5105 Insomnia due to other mental disorder: Secondary | ICD-10-CM

## 2020-01-24 DIAGNOSIS — F419 Anxiety disorder, unspecified: Secondary | ICD-10-CM

## 2020-01-24 DIAGNOSIS — Z9989 Dependence on other enabling machines and devices: Secondary | ICD-10-CM

## 2020-01-24 NOTE — Patient Instructions (Signed)
CPAP and BPAP Information CPAP and BPAP are methods of helping a person breathe with the use of air pressure. CPAP stands for "continuous positive airway pressure." BPAP stands for "bi-level positive airway pressure." In both methods, air is blown through your nose or mouth and into your air passages to help you breathe well. CPAP and BPAP use different amounts of pressure to blow air. With CPAP, the amount of pressure stays the same while you breathe in and out. With BPAP, the amount of pressure is increased when you breathe in (inhale) so that you can take larger breaths. Your health care provider will recommend whether CPAP or BPAP would be more helpful for you. Why are CPAP and BPAP treatments used? CPAP or BPAP can be helpful if you have:  Sleep apnea.  Chronic obstructive pulmonary disease (COPD).  Heart failure.  Medical conditions that weaken the muscles of the chest including muscular dystrophy, or neurological diseases such as amyotrophic lateral sclerosis (ALS).  Other problems that cause breathing to be weak, abnormal, or difficult. CPAP is most commonly used for obstructive sleep apnea (OSA) to keep the airways from collapsing when the muscles relax during sleep. How is CPAP or BPAP administered? Both CPAP and BPAP are provided by a small machine with a flexible plastic tube that attaches to a plastic mask. You wear the mask. Air is blown through the mask into your nose or mouth. The amount of pressure that is used to blow the air can be adjusted on the machine. Your health care provider will determine the pressure setting that should be used based on your individual needs. When should CPAP or BPAP be used? In most cases, the mask only needs to be worn during sleep. Generally, the mask needs to be worn throughout the night and during any daytime naps. People with certain medical conditions may also need to wear the mask at other times when they are awake. Follow instructions from your  health care provider about when to use the machine. What are some tips for using the mask?   Because the mask needs to be snug, some people feel trapped or closed-in (claustrophobic) when first using the mask. If you feel this way, you may need to get used to the mask. One way to do this is by holding the mask loosely over your nose or mouth and then gradually applying the mask more snugly. You can also gradually increase the amount of time that you use the mask.  Masks are available in various types and sizes. Some fit over your mouth and nose while others fit over just your nose. If your mask does not fit well, talk with your health care provider about getting a different one.  If you are using a mask that fits over your nose and you tend to breathe through your mouth, a chin strap may be applied to help keep your mouth closed.  The CPAP and BPAP machines have alarms that may sound if the mask comes off or develops a leak.  If you have trouble with the mask, it is very important that you talk with your health care provider about finding a way to make the mask easier to tolerate. Do not stop using the mask. Stopping the use of the mask could have a negative impact on your health. What are some tips for using the machine?  Place your CPAP or BPAP machine on a secure table or stand near an electrical outlet.  Know where the on/off   switch is located on the machine.  Follow instructions from your health care provider about how to set the pressure on your machine and when you should use it.  Do not eat or drink while the CPAP or BPAP machine is on. Food or fluids could get pushed into your lungs by the pressure of the CPAP or BPAP.  Do not smoke. Tobacco smoke residue can damage the machine.  For home use, CPAP and BPAP machines can be rented or purchased through home health care companies. Many different brands of machines are available. Renting a machine before purchasing may help you find out  which particular machine works well for you.  Keep the CPAP or BPAP machine and attachments clean. Ask your health care provider for specific instructions. Get help right away if:  You have redness or open areas around your nose or mouth where the mask fits.  You have trouble using the CPAP or BPAP machine.  You cannot tolerate wearing the CPAP or BPAP mask.  You have pain, discomfort, and bloating in your abdomen. Summary  CPAP and BPAP are methods of helping a person breathe with the use of air pressure.  Both CPAP and BPAP are provided by a small machine with a flexible plastic tube that attaches to a plastic mask.  If you have trouble with the mask, it is very important that you talk with your health care provider about finding a way to make the mask easier to tolerate. This information is not intended to replace advice given to you by your health care provider. Make sure you discuss any questions you have with your health care provider. Document Revised: 06/03/2018 Document Reviewed: 01/01/2016 Elsevier Patient Education  2020 Else[employer] Insomnia Insomnia is a sleep disorder that makes it difficult to fall asleep or stay asleep. Insomnia can cause fatigue, low energy, difficulty concentrating, mood swings, and poor performance at work or school. There are three different ways to classify insomnia:  Difficulty falling asleep.  Difficulty staying asleep.  Waking up too early in the morning. Any type of insomnia can be long-term (chronic) or short-term (acute). Both are common. Short-term insomnia usually lasts for three months or less. Chronic insomnia occurs at least three times a week for longer than three months. What are the causes? Insomnia may be caused by another condition, situation, or substance, such as:  Anxiety.  Certain medicines.  Gastroesophageal reflux disease (GERD) or other gastrointestinal conditions.  Asthma or other breathing  conditions.  Restless legs syndrome, sleep apnea, or other sleep disorders.  Chronic pain.  Menopause.  Stroke.  Abuse of alcohol, tobacco, or illegal drugs.  Mental health conditions, such as depression.  Caffeine.  Neurological disorders, such as Alzheimer's disease.  An overactive thyroid (hyperthyroidism). Sometimes, the cause of insomnia may not be known. What increases the risk? Risk factors for insomnia include:  Gender. Women are affected more often than men.  Age. Insomnia is more common as you get older.  Stress.  Lack of exercise.  Irregular work schedule or working night shifts.  Traveling between different time zones.  Certain medical and mental health conditions. What are the signs or symptoms? If you have insomnia, the main symptom is having trouble falling asleep or having trouble staying asleep. This may lead to other symptoms, such as:  Feeling fatigued or having low energy.  Feeling nervous about going to sleep.  Not feeling rested in the morning.  Having trouble concentrating.  Feeling irritable, anxious, or depressed. How   is this diagnosed? This condition may be diagnosed based on:  Your symptoms and medical history. Your health care provider may ask about: ? Your sleep habits. ? Any medical conditions you have. ? Your mental health.  A physical exam. How is this treated? Treatment for insomnia depends on the cause. Treatment may focus on treating an underlying condition that is causing insomnia. Treatment may also include:  Medicines to help you sleep.  Counseling or therapy.  Lifestyle adjustments to help you sleep better. Follow these instructions at home: Eating and drinking   Limit or avoid alcohol, caffeinated beverages, and cigarettes, especially close to bedtime. These can disrupt your sleep.  Do not eat a large meal or eat spicy foods right before bedtime. This can lead to digestive discomfort that can make it hard  for you to sleep. Sleep habits   Keep a sleep diary to help you and your health care provider figure out what could be causing your insomnia. Write down: ? When you sleep. ? When you wake up during the night. ? How well you sleep. ? How rested you feel the next day. ? Any side effects of medicines you are taking. ? What you eat and drink.  Make your bedroom a dark, comfortable place where it is easy to fall asleep. ? Put up shades or blackout curtains to block light from outside. ? Use a white noise machine to block noise. ? Keep the temperature cool.  Limit screen use before bedtime. This includes: ? Watching TV. ? Using your smartphone, tablet, or computer.  Stick to a routine that includes going to bed and waking up at the same times every day and night. This can help you fall asleep faster. Consider making a quiet activity, such as reading, part of your nighttime routine.  Try to avoid taking naps during the day so that you sleep better at night.  Get out of bed if you are still awake after 15 minutes of trying to sleep. Keep the lights down, but try reading or doing a quiet activity. When you feel sleepy, go back to bed. General instructions  Take over-the-counter and prescription medicines only as told by your health care provider.  Exercise regularly, as told by your health care provider. Avoid exercise starting several hours before bedtime.  Use relaxation techniques to manage stress. Ask your health care provider to suggest some techniques that may work well for you. These may include: ? Breathing exercises. ? Routines to release muscle tension. ? Visualizing peaceful scenes.  Make sure that you drive carefully. Avoid driving if you feel very sleepy.  Keep all follow-up visits as told by your health care provider. This is important. Contact a health care provider if:  You are tired throughout the day.  You have trouble in your daily routine due to  sleepiness.  You continue to have sleep problems, or your sleep problems get worse. Get help right away if:  You have serious thoughts about hurting yourself or someone else. If you ever feel like you may hurt yourself or others, or have thoughts about taking your own life, get help right away. You can go to your nearest emergency department or call:  Your local emergency services (911 in the [place]  A suicide crisis helpline, such as the National Suicide Prevention Lifeline at 1-800-273-8255. This is open 24 hours a day. Summary  Insomnia is a sleep disorder that makes it difficult to fall asleep or stay asleep.  Insomnia can be   long-term (chronic) or short-term (acute).  Treatment for insomnia depends on the cause. Treatment may focus on treating an underlying condition that is causing insomnia.  Keep a sleep diary to help you and your health care provider figure out what could be causing your insomnia. This information is not intended to replace advice given to you by your health care provider. Make sure you discuss any questions you have with your health care provider. Document Revised: 01/24/2017 Document Reviewed: 11/21/2016 Elsevier Patient Education  2020 Elsevier Inc.  

## 2020-01-24 NOTE — Progress Notes (Signed)
[facility] [address] [place], [place] [address]  Pulmonary Sleep Medicine   Office Visit Note  Patient Name: Valerie Yu DOB: Jan 05, 1956 MRN [id number]    Chief Complaint: Obstructive Sleep Apnea visit  Brief History:  Arelyn is seen today for follow The patient has a 8 year history of sleep apnea. She recently received a replacement CPAP. Patient is using PAP nightly but reports that she is not sleeping due to the mask leak.  The patient normally feels more rested after sleeping with PAP.  The patient reports benefiting from PAP use. Reported sleepiness is  improved and the Epworth Sleepiness Score is 0 out of 24. The patient does not take naps. The patient complains of the following: mask leak and dry mouth  The compliance download shows excellent compliance with an average use time of 6.6 hours. The AHI is 1.5  The patient does not of limb movements disrupting sleep. She has difficulty sleeping due to her caregiving responsibilities ROS  General: (-) fever, (-) chills, (-) night sweat Nose and Sinuses: (-) nasal stuffiness or itchiness, (-) postnasal drip, (-) nosebleeds, (-) sinus trouble. Mouth and Throat: (-) sore throat, (-) hoarseness. Neck: (-) swollen glands, (-) enlarged thyroid, (-) neck pain. Respiratory: - cough, - shortness of breath, - wheezing. Neurologic: - numbness, - tingling. Psychiatric: - anxiety, - depression   Current Medication: Outpatient Encounter Medications as of 01/24/2020  Medication Sig  . aspirin 81 MG chewable tablet Chew 81 mg by mouth daily.  . busPIRone (BUSPAR) 5 MG tablet   . fluticasone (FLONASE) 50 MCG/ACT nasal spray Place 2 sprays into both nostrils as needed for allergies or rhinitis.  Marland Kitchen meclizine (ANTIVERT) 25 MG tablet TAKE ONE HALF TO 1 TABLET BY MOUTH EVERY 6 HOURS AS NEEDED FOR DIZZINESS. WATCH FOR SEDATION. (Patient not taking: Reported on 10/07/2019)  . naproxen (NAPROSYN) 500 MG tablet Take 1 tablet (500  mg total) by mouth 2 (two) times daily with a meal.  . NON FORMULARY cpap device  . orlistat (XENICAL) 120 MG capsule Take by mouth.  . simvastatin (ZOCOR) 40 MG tablet Take 40 mg by mouth daily at 6 PM.  . VENTOLIN HFA 108 (90 Base) MCG/ACT inhaler Inhale 2 puffs into the lungs every 4 (four) hours as needed for wheezing or shortness of breath.  . zolpidem (AMBIEN) 10 MG tablet Take 10 mg by mouth at bedtime.   No facility-administered encounter medications on file as of 01/24/2020.    Surgical History: Past Surgical History:  Procedure Laterality Date  . ENDOBRONCHIAL ULTRASOUND N/A 12/21/2014   Procedure: ENDOBRONCHIAL ULTRASOUND;  Surgeon: Erin Fulling, MD;  Location: [facility] ORS;  Service: Cardiopulmonary;  Laterality: N/A;    Medical History: Past Medical History:  Diagnosis Date  . Anxiety   . GERD (gastroesophageal reflux disease)   . History of panic attacks   . Hyposomnia, insomnia or sleeplessness associated with anxiety   . Sarcoidosis   . Shortness of breath dyspnea   . Sleep apnea     Family History: Non contributory to the present illness  Social History: Social History   Socioeconomic History  . Marital status: Married    Spouse name: Not on file  . Number of children: Not on file  . Years of education: Not on file  . Highest education level: Not on file  Occupational History  . Not on file  Tobacco Use  . Smoking status: Never Smoker  . Smokeless tobacco: Never Used  Vaping  Use  . Vaping Use: Never used  Substance and Sexual Activity  . Alcohol use: No  . Drug use: No  . Sexual activity: Not on file  Other Topics Concern  . Not on file  Social History Narrative  . Not on file   Social Determinants of Health   Financial Resource Strain:   . Difficulty of Paying Living Expenses: Not on file  Food Insecurity:   . Worried About Programme researcher, broadcasting/film/video in the Last Year: Not on file  . Ran Out of Food in the Last Year: Not on file  Transportation  Needs:   . Lack of Transportation (Medical): Not on file  . Lack of Transportation (Non-Medical): Not on file  Physical Activity:   . Days of Exercise per Week: Not on file  . Minutes of Exercise per Session: Not on file  Stress:   . Feeling of Stress : Not on file  Social Connections:   . Frequency of Communication with Friends and Family: Not on file  . Frequency of Social Gatherings with Friends and Family: Not on file  . Attends Religious Services: Not on file  . Active Member of Clubs or Organizations: Not on file  . Attends Banker Meetings: Not on file  . Marital Status: Not on file  Intimate Partner Violence:   . Fear of Current or Ex-Partner: Not on file  . Emotionally Abused: Not on file  . Physically Abused: Not on file  . Sexually Abused: Not on file    Vital Signs: Blood pressure 132/86, pulse 71, temperature (!) 97.5 F (36.4 C), temperature source Temporal, resp. rate 16, height 5\' 4"  (1.626 m), weight 204 lb (92.5 kg), SpO2 99 %.  Examination: General Appearance: The patient is well-developed, well-nourished, and in no distress. Neck Circumference: 46  Skin: Gross inspection of skin unremarkable. Head: normocephalic, no gross deformities. Eyes: no gross deformities noted. ENT: ears appear grossly normal Neurologic: Alert and oriented. No involuntary movements.    EPWORTH SLEEPINESS SCALE:  Scale:  (0)= no chance of dozing; (1)= slight chance of dozing; (2)= moderate chance of dozing; (3)= high chance of dozing  Chance  Situtation    Sitting and reading: 0    Watching TV: 0    Sitting Inactive in public: 0    As a passenger in car: 0      Lying down to rest: 0    Sitting and talking: 0    Sitting quielty after lunch: 0    In a car, stopped in traffic: 0   TOTAL SCORE:   0 out of 24    SLEEP STUDIES:  1. Split 12/13 AHI 108 SpO60min 82%, CPAP 8 cm H2O   CPAP COMPLIANCE DATA:  Date Range: 12/25/19 -01/23/20   Average  Daily Use: 6.6 hours  Median Use: 7  Compliance for > 4 Hours: 93%  AHI: 1.5 respiratory events per hour  Days Used: 29/30  Mask Leak: 73  95th Percentile Pressure: 10         LABS: No results found for this or any previous visit (from the past 2160 hour(s)).  Radiology: DG Knee Complete 4 Views Left  Result Date: 10/16/2019 CLINICAL DATA:  Fall with bilateral knee pain EXAM: LEFT KNEE - COMPLETE 4+ VIEW COMPARISON:  None. FINDINGS: No sizable effusion accounting for rotation. Possible spurring versus a minimally displaced fracture of the lateral tibial spine. A tiny corticated ossification is seen along the anterior joint line, possibly joint  body. Additional well corticated, likely enthesopathic ossifications are seen along the anterosuperior patella at the distal quadriceps insertion. There is however some mild thickening of the patellar tendon in the anterior soft tissue thickening though this may be contusive in nature given recent fall. IMPRESSION: 1. Possible spurring versus a minimally displaced fracture of the lateral tibial spine. 2. Mild thickening of the patellar tendon and anterior soft tissue thickening, possibly contusive given fall. 3. Tiny corticated ossification along the anterior joint line, likely remote joint body. Electronically Signed   By: Kreg Shropshire M.D.   On: 10/16/2019 21:24   DG Knee Complete 4 Views Right  Result Date: 10/16/2019 CLINICAL DATA:  Fall, bilateral knee pain EXAM: RIGHT KNEE - COMPLETE 4+ VIEW COMPARISON:  None. FINDINGS: Four view radiograph right knee demonstrates normal alignment. No fracture or dislocation. Mild medial compartment degenerative arthritis with asymmetric joint space narrowing and osteophyte formation. No effusion. Soft tissues are unremarkable. IMPRESSION: Mild medial compartment degenerative arthritis. No acute findings. Electronically Signed   By: Helyn Numbers MD   On: 10/16/2019 21:27    No results found.  No  results found.    Assessment and Plan: Patient Active Problem List   Diagnosis Date Noted  . Obesity (BMI 30-39.9) 01/24/2020  . OSA on CPAP 12/20/2019  . CPAP use counseling 12/20/2019  . Hyposomnia, insomnia or sleeplessness associated with anxiety   . Sarcoidosis   . Mediastinal adenopathy 12/21/2014      The patient does tolerate PAP and reports significant benefit from PAP use. She has been having some issues related to mask fit and wants to switch back to the nasal mask.The patient was reminded how to clean the unit and adjust the humidifier setting. Once the leak is controlled the humidity should improve as well.  The compliance is excellent. The apnea is well controlled.   1. OSA- continue excellent compliance. 2.  Insomnia  - followed up with previous discussion about CBTi and sleep hygiene, stimulus control. Encouraged pt to utilize resources and trial CBTi.  3. CPAP couseling-Discussed importance of adequate CPAP use as well as proper care and cleaning techniques of machine and all supplies. 4. Obesity: Discussed the importance of weight management through healthy eating and daily exercise. Encouraged increase protein & non starchy vegetables over 3-5 small meals daily. Minimize processed carb/sugars.  Discussed the negative effects obesity has on pulmonary health, cardiac health as well as overall general health and well being.   General Counseling: I have discussed the findings of the evaluation and examination with Kevin Fenton.  I have also discussed any further diagnostic evaluation thatmay be needed or ordered today. Erlean verbalizes understanding of the findings of todays visit. We also reviewed her medications today and discussed drug interactions and side effects including but not limited excessive drowsiness and altered mental states. We also discussed that there is always a risk not just to her but also people around her. she has been encouraged to call the office with any  questions or concerns that should arise related to todays visit.  No orders of the defined types were placed in this encounter.     This patient was seen by Layla Barter, AGNP-C in collaboration with Dr. Freda Munro as a part of collaborative care agreement.  I have personally obtained a history, examined the patient, evaluated laboratory and imaging results, formulated the assessment and plan and placed orders.   Valentino Hue Sol Blazing, PhD, FAASM  Diplomate, American Board of Sleep Medicine  Allyne Gee, MD [facility] Diplomate ABMS Pulmonary and Critical Care Medicine Sleep medicine

## 2020-03-01 ENCOUNTER — Other Ambulatory Visit: Payer: Self-pay | Admitting: Family Medicine

## 2020-03-01 DIAGNOSIS — Z1231 Encounter for screening mammogram for malignant neoplasm of breast: Secondary | ICD-10-CM

## 2020-03-31 ENCOUNTER — Ambulatory Visit
Admission: RE | Admit: 2020-03-31 | Discharge: 2020-03-31 | Disposition: A | Discharge status: Home / Self Care | Payer: No Typology Code available for payment source | Source: Ambulatory Visit | Attending: Family Medicine | Admitting: Family Medicine

## 2020-03-31 ENCOUNTER — Other Ambulatory Visit: Payer: Self-pay

## 2020-03-31 DIAGNOSIS — Z1231 Encounter for screening mammogram for malignant neoplasm of breast: Secondary | ICD-10-CM | POA: Diagnosis not present

## 2020-04-10 ENCOUNTER — Ambulatory Visit: Payer: No Typology Code available for payment source | Admitting: Physician Assistant

## 2020-09-04 ENCOUNTER — Ambulatory Visit (INDEPENDENT_AMBULATORY_CARE_PROVIDER_SITE_OTHER): Payer: No Typology Code available for payment source | Admitting: Internal Medicine

## 2020-09-04 VITALS — BP 142/87 | HR 86 | Resp 18 | Ht 64.0 in | Wt 193.8 lb

## 2020-09-04 DIAGNOSIS — Z9989 Dependence on other enabling machines and devices: Secondary | ICD-10-CM

## 2020-09-04 DIAGNOSIS — Z7189 Other specified counseling: Secondary | ICD-10-CM

## 2020-09-04 DIAGNOSIS — D869 Sarcoidosis, unspecified: Secondary | ICD-10-CM

## 2020-09-04 DIAGNOSIS — G4733 Obstructive sleep apnea (adult) (pediatric): Secondary | ICD-10-CM | POA: Diagnosis not present

## 2020-09-04 NOTE — Patient Instructions (Signed)

## 2020-09-04 NOTE — Progress Notes (Signed)
[facility] [address] [place], [place] [address]  Pulmonary Sleep Medicine   Office Visit Note  Patient Name: Valerie Yu DOB: 10/10/1955 MRN [id number]    Chief Complaint: Obstructive Sleep Apnea visit  Brief History:  Valerie Yu is seen today for followup consult because she stated [place]  [place] and [place] to get a replacement.   She is currently using an older machine, but doesn't get enough. The patient has a 8.5 year history of sleep apnea. Patient is using PAP nightly.  The patient feels better after sleeping with PAP.  The patient reports improved sleep from PAP use. Reported sleepiness is  resolved and the Epworth Sleepiness Score is 0 out of 24. The patient does not take naps. The patient complains of the following: needing a new machine  The compliance download shows  compliance with an average use time of 6:  hours @ 90%. The AHI is not available on this older unit  The patient does not complain of limb movements disrupting sleep.  ROS  General: (-) fever, (-) chills, (-) night sweat Nose and Sinuses: (-) nasal stuffiness or itchiness, (-) postnasal drip, (-) nosebleeds, (-) sinus trouble. Mouth and Throat: (-) sore throat, (-) hoarseness. Neck: (-) swollen glands, (-) enlarged thyroid, (-) neck pain. Respiratory: - cough, - shortness of breath, - wheezing. Neurologic: +numbness, - tingling. Psychiatric: + anxiety, + depression   Current Medication: Outpatient Encounter Medications as of 09/04/2020  Medication Sig   albuterol (VENTOLIN HFA) 108 (90 Base) MCG/ACT inhaler Inhale into the lungs.   aspirin 81 MG EC tablet Take by mouth.   busPIRone (BUSPAR) 5 MG tablet Take by mouth.   esomeprazole (NEXIUM) 40 MG capsule Take by mouth.   fluticasone (FLONASE) 50 MCG/ACT nasal spray Place into the nose.   simvastatin (ZOCOR) 40 MG tablet Take by mouth.   zolpidem (AMBIEN) 10 MG tablet Take by mouth.   aspirin 81 MG chewable tablet  Chew 81 mg by mouth daily.   busPIRone (BUSPAR) 5 MG tablet    fluticasone (FLONASE) 50 MCG/ACT nasal spray Place 2 sprays into both nostrils as needed for allergies or rhinitis.   meclizine (ANTIVERT) 25 MG tablet TAKE ONE HALF TO 1 TABLET BY MOUTH EVERY 6 HOURS AS NEEDED FOR DIZZINESS. WATCH FOR SEDATION. (Patient not taking: Reported on 10/07/2019)   naproxen (NAPROSYN) 500 MG tablet Take 1 tablet (500 mg total) by mouth 2 (two) times daily with a meal.   NON FORMULARY cpap device   orlistat (XENICAL) 120 MG capsule Take by mouth.   phentermine (ADIPEX-P) 37.5 MG tablet Take 37.5 mg by mouth every morning.   simvastatin (ZOCOR) 40 MG tablet Take 40 mg by mouth daily at 6 PM.   VENTOLIN HFA 108 (90 Base) MCG/ACT inhaler Inhale 2 puffs into the lungs every 4 (four) hours as needed for wheezing or shortness of breath.   zolpidem (AMBIEN) 10 MG tablet Take 10 mg by mouth at bedtime.   No facility-administered encounter medications on file as of 09/04/2020.    Surgical History: Past Surgical History:  Procedure Laterality Date   ENDOBRONCHIAL ULTRASOUND N/A 12/21/2014   Procedure: ENDOBRONCHIAL ULTRASOUND;  Surgeon: Erin Fulling, MD;  Location: [facility] ORS;  Service: Cardiopulmonary;  Laterality: N/A;    Medical History: Past Medical History:  Diagnosis Date   Anxiety    GERD (gastroesophageal reflux disease)    History of panic attacks    Hyposomnia, insomnia or sleeplessness associated  with anxiety    Sarcoidosis    Shortness of breath dyspnea    Sleep apnea     Family History: Non contributory to the present illness  Social History: Social History   Socioeconomic History   Marital status: Married    Spouse name: Not on file   Number of children: Not on file   Years of education: Not on file   Highest education level: Not on file  Occupational History   Not on file  Tobacco Use   Smoking status: Never   Smokeless tobacco: Never  Vaping Use   Vaping Use: Never used   Substance and Sexual Activity   Alcohol use: No   Drug use: No   Sexual activity: Not on file  Other Topics Concern   Not on file  Social History Narrative   Not on file   Social Determinants of Health   Financial Resource Strain: Not on file  Food Insecurity: Not on file  Transportation Needs: Not on file  Physical Activity: Not on file  Stress: Not on file  Social Connections: Not on file  Intimate Partner Violence: Not on file    Vital Signs: Blood pressure (!) 142/87, pulse 86, resp. rate 18, height 5\' 4"  (1.626 m), weight 193 lb 12.8 oz (87.9 kg), SpO2 99 %.  Examination: General Appearance: The patient is well-developed, well-nourished, and in no distress. Neck Circumference: 35 Skin: Gross inspection of skin unremarkable. Head: normocephalic, no gross deformities. Eyes: no gross deformities noted. ENT: ears appear grossly normal Neurologic: Alert and oriented. No involuntary movements.    EPWORTH SLEEPINESS SCALE:  Scale:  (0)= no chance of dozing; (1)= slight chance of dozing; (2)= moderate chance of dozing; (3)= high chance of dozing  Chance  Situtation    Sitting and reading: 0    Watching TV: 0    Sitting Inactive in public: 0    As a passenger in car: 0      Lying down to rest: 0    Sitting and talking: 0    Sitting quielty after lunch: 0    In a car, stopped in traffic: 0   TOTAL SCORE:   0 out of 24    SLEEP STUDIES:  Split 12/13 - AHI 103,  SpO2 min 82%   CPAP COMPLIANCE DATA:  Date Range: 08/05/20-09/03/20  Average Daily Use: 6:10 hours  Median Use: 6:40  Compliance for > 4 Hours: 90%  AHI: n/a respiratory events per hour  Days Used: 28/30 days  Mask Leak: n/a  95th Percentile Pressure: 11cmH2O         LABS: No results found for this or any previous visit (from the past 2160 hour(s)).  Radiology: MM 3D SCREEN BREAST BILATERAL  Result Date: 03/31/2020 CLINICAL DATA:  Screening. EXAM: DIGITAL SCREENING  BILATERAL MAMMOGRAM WITH TOMOSYNTHESIS AND CAD COMPARISON:  Previous exam(s). ACR Breast Density Category b: There are scattered areas of fibroglandular density. FINDINGS: There are no findings suspicious for malignancy. The images were evaluated with computer-aided detection. IMPRESSION: No mammographic evidence of malignancy. A result letter of this screening mammogram will be mailed directly to the patient. RECOMMENDATION: Screening mammogram in one year. (Code:[id number]-B-01Y) BI-RADS CATEGORY  1: Negative. Electronically Signed   By: 05/29/2020 M.D.   On: 03/31/2020 16:19    No results found.  No results found.    Assessment and Plan: Patient Active Problem List   Diagnosis Date Noted   Obesity (BMI 30-39.9) 01/24/2020   OSA on  CPAP 12/20/2019   CPAP use counseling 12/20/2019   Hyposomnia, insomnia or sleeplessness associated with anxiety    Sarcoidosis    Mediastinal adenopathy 12/21/2014   1. OSA on CPAP The patient does tolerate PAP and reports  benefit from PAP use. Her machine was lost when she took a trip to [place], so she needs a replacement. She has been using an old machine, we will change the pressure in it to 10 cm H20 as this is the pressure that controls her apnea. The AHI is not available but the compliance is very good at 93%.  The patient was reminded how to clean equipment and advised to replace supplies routinely. The patient was also counselled on weight loss. The compliance is very good. The AHI is unavailable.   OSA- continue cpap with very good compliance, replace lost machine.    2. CPAP use counseling CPAP Counseling: had a lengthy discussion with the patient regarding the importance of PAP therapy in management of the sleep apnea. Patient appears to understand the risk factor reduction and also understands the risks associated with untreated sleep apnea. Patient will try to make a good faith effort to remain compliant with therapy. Also instructed the patient on  proper cleaning of the device including the water must be changed daily if possible and use of distilled water is preferred. Patient understands that the machine should be regularly cleaned with appropriate recommended cleaning solutions that do not damage the PAP machine for example given white vinegar and water rinses. Other methods such as ozone treatment may not be as good as these simple methods to achieve cleaning.     General Counseling: I have discussed the findings of the evaluation and examination with Kevin Fenton.  I have also discussed any further diagnostic evaluation thatmay be needed or ordered today. Adara verbalizes understanding of the findings of todays visit. We also reviewed her medications today and discussed drug interactions and side effects including but not limited excessive drowsiness and altered mental states. We also discussed that there is always a risk not just to her but also people around her. she has been encouraged to call the office with any questions or concerns that should arise related to todays visit.  No orders of the defined types were placed in this encounter.       I have personally obtained a history, examined the patient, evaluated laboratory and imaging results, formulated the assessment and plan and placed orders.   This patient was seen today by Emmaline Kluver, PA-C in collaboration with Dr. Freda Munro.    Yevonne Pax, MD [facility] Diplomate ABMS Pulmonary and Critical Care Medicine Sleep medicine

## 2021-01-22 ENCOUNTER — Ambulatory Visit: Payer: No Typology Code available for payment source

## 2021-01-22 NOTE — Progress Notes (Unsigned)
Pt canceled his appointment.  

## 2021-03-02 ENCOUNTER — Other Ambulatory Visit: Payer: Self-pay | Admitting: Family Medicine

## 2021-03-02 DIAGNOSIS — Z1231 Encounter for screening mammogram for malignant neoplasm of breast: Secondary | ICD-10-CM

## 2021-04-08 ENCOUNTER — Emergency Department: Payer: No Typology Code available for payment source

## 2021-04-08 ENCOUNTER — Emergency Department
Admission: EM | Admit: 2021-04-08 | Discharge: 2021-04-08 | Disposition: A | Discharge status: Home / Self Care | Payer: No Typology Code available for payment source | Attending: Emergency Medicine | Admitting: Emergency Medicine

## 2021-04-08 ENCOUNTER — Other Ambulatory Visit: Payer: Self-pay

## 2021-04-08 DIAGNOSIS — R0789 Other chest pain: Secondary | ICD-10-CM

## 2021-04-08 DIAGNOSIS — R42 Dizziness and giddiness: Secondary | ICD-10-CM | POA: Diagnosis not present

## 2021-04-08 DIAGNOSIS — R6883 Chills (without fever): Secondary | ICD-10-CM | POA: Insufficient documentation

## 2021-04-08 DIAGNOSIS — Z20822 Contact with and (suspected) exposure to covid-19: Secondary | ICD-10-CM | POA: Diagnosis not present

## 2021-04-08 LAB — CBC
HCT: 38.1 % (ref 36.0–46.0)
Hemoglobin: 12.3 g/dL (ref 12.0–15.0)
MCH: 28.2 pg (ref 26.0–34.0)
MCHC: 32.3 g/dL (ref 30.0–36.0)
MCV: 87.4 fL (ref 80.0–100.0)
Platelets: 198 10*3/uL (ref 150–400)
RBC: 4.36 MIL/uL (ref 3.87–5.11)
RDW: 12.9 % (ref 11.5–15.5)
WBC: 6.8 10*3/uL (ref 4.0–10.5)
nRBC: 0 % (ref 0.0–0.2)

## 2021-04-08 LAB — BASIC METABOLIC PANEL
Anion gap: 7 (ref 5–15)
BUN: 28 mg/dL — ABNORMAL HIGH (ref 8–23)
CO2: 26 mmol/L (ref 22–32)
Calcium: 9 mg/dL (ref 8.9–10.3)
Chloride: 105 mmol/L (ref 98–111)
Creatinine, Ser: 1.12 mg/dL — ABNORMAL HIGH (ref 0.44–1.00)
GFR, Estimated: 55 mL/min — ABNORMAL LOW (ref >60)
Glucose, Bld: 175 mg/dL — ABNORMAL HIGH (ref 70–99)
Potassium: 3.3 mmol/L — ABNORMAL LOW (ref 3.5–5.1)
Sodium: 138 mmol/L (ref 135–145)

## 2021-04-08 LAB — RESP PANEL BY RT-PCR (FLU A&B, COVID) ARPGX2
Influenza A by PCR: NEGATIVE
Influenza B by PCR: NEGATIVE
SARS Coronavirus 2 by RT PCR: NEGATIVE

## 2021-04-08 LAB — TROPONIN I (HIGH SENSITIVITY): Troponin I (High Sensitivity): 3 ng/L (ref <18)

## 2021-04-08 NOTE — ED Triage Notes (Signed)
Pt to ED for left sided chest pain for past few days. +nausea and dizziness with shob. NAD noted.

## 2021-04-08 NOTE — ED Provider Notes (Signed)
[facility] Provider Note    Event Date/Time   First MD Initiated Contact with Patient 04/08/21 1337     (approximate)   History   Chest Pain   HPI  Valerie Yu is a 66 y.o. female with a history of GERD, sarcoidosis, anxiety who presents with complaints of chest discomfort, chills, lightheadedness.  She reports the symptoms started 2 days ago, seem to be worse when lying down initially.  Had been feeling better but then it occurred again today.  No shortness of breath.  No calf pain or leg swelling.  No significant cough.      Physical Exam   Triage Vital Signs: ED Triage Vitals  Enc Vitals Group     BP 04/08/21 1326 (!) 161/91     Pulse Rate 04/08/21 1326 86     Resp 04/08/21 1326 18     Temp 04/08/21 1326 98.1 F (36.7 C)     Temp src --      SpO2 04/08/21 1326 100 %     Weight 04/08/21 1328 88 kg (194 lb)     Height 04/08/21 1328 1.651 m (5\' 5" )     Head Circumference --      Peak Flow --      Pain Score 04/08/21 1328 5     Pain Loc --      Pain Edu? --      Excl. in GC? --     Most recent vital signs: Vitals:   04/08/21 1326 04/08/21 1530  BP: (!) 161/91 (!) 152/82  Pulse: 86 87  Resp: 18 20  Temp: 98.1 F (36.7 C)   SpO2: 100% 100%     General: Awake, no distress.  CV:  Good peripheral perfusion.  No murmur, regular rate and rhythm Resp:  Normal effort.  Clear to auscultation bilaterally Abd:  No distention.  Other:  No lower extremity edema   ED Results / Procedures / Treatments   Labs (all labs ordered are listed, but only abnormal results are displayed) Labs Reviewed  BASIC METABOLIC PANEL - Abnormal; Notable for the following components:      Result Value   Potassium 3.3 (*)    Glucose, Bld 175 (*)    BUN 28 (*)    Creatinine, Ser 1.12 (*)    GFR, Estimated 55 (*)    All other components within normal limits  RESP PANEL BY RT-PCR (FLU A&B, COVID) ARPGX2  CBC  TROPONIN I (HIGH SENSITIVITY)      EKG  ED ECG REPORT I, 06/06/21, the attending physician, personally viewed and interpreted this ECG.  Date: 04/08/2021  Rhythm: normal sinus rhythm QRS Axis: normal Intervals: normal ST/T Wave abnormalities: normal Narrative Interpretation: no evidence of acute ischemia    RADIOLOGY Chest x-ray reviewed by me, no evidence of pneumonia    PROCEDURES:  Critical Care performed:   Procedures   MEDICATIONS ORDERED IN ED: Medications - No data to display   IMPRESSION / MDM / ASSESSMENT AND PLAN / ED COURSE  I reviewed the triage vital signs and the nursing notes.  Patient presents with symptoms as described above, suspicious for possible viral symptoms given description of chills and body aches along with lightheadedness.  Will check COVID flu.  Doubt ACS is reassuring EKG  High-sensitivity troponin is normal.  BMP consistent with mild dehydration, CBC is normal   COVID flu PCR test is negative  Patient reevaluated, she is feeling well, asymptomatic at  this time.  Discussed admission with the patient however given asymptomatic, feeling better, unclear cause of her symptoms she would prefer outpatient follow-up.  Encouraged her to increase p.o. hydration, outpatient follow-up with cardiology, strict return precautions discussed.           FINAL CLINICAL IMPRESSION(S) / ED DIAGNOSES   Final diagnoses:  Atypical chest pain     Rx / DC Orders   ED Discharge Orders     None        Note:  This document was prepared using Dragon voice recognition software and may include unintentional dictation errors.   Jene Every, MD 04/08/21 [phone]

## 2021-04-09 ENCOUNTER — Ambulatory Visit
Admission: RE | Admit: 2021-04-09 | Discharge: 2021-04-09 | Disposition: A | Discharge status: Home / Self Care | Payer: No Typology Code available for payment source | Source: Ambulatory Visit | Attending: Family Medicine | Admitting: Family Medicine

## 2021-04-09 DIAGNOSIS — Z1231 Encounter for screening mammogram for malignant neoplasm of breast: Secondary | ICD-10-CM | POA: Insufficient documentation

## 2021-09-24 ENCOUNTER — Ambulatory Visit (INDEPENDENT_AMBULATORY_CARE_PROVIDER_SITE_OTHER): Payer: BC Managed Care – PPO | Admitting: Internal Medicine

## 2021-09-24 VITALS — BP 153/82 | HR 70 | Resp 12 | Ht 64.0 in | Wt 194.6 lb

## 2021-09-24 DIAGNOSIS — Z9989 Dependence on other enabling machines and devices: Secondary | ICD-10-CM | POA: Diagnosis not present

## 2021-09-24 DIAGNOSIS — Z7189 Other specified counseling: Secondary | ICD-10-CM | POA: Diagnosis not present

## 2021-09-24 DIAGNOSIS — G4733 Obstructive sleep apnea (adult) (pediatric): Secondary | ICD-10-CM

## 2021-09-24 NOTE — Progress Notes (Signed)
[facility] [address] [place], [place] [address]  Pulmonary Sleep Medicine   Office Visit Note  Patient Name: Valerie Yu DOB: Jul 12, 1955 MRN [id number]    Chief Complaint: Obstructive Sleep Apnea visit  Brief History:  Valerie Yu is seen today for an annual follow up on CPAP@10cmh20 .  The patient has a 10 year history of sleep apnea. Patient is not using PAP nightly.  The patient feels sometimes rested  after sleeping with PAP.  The patient reports benefit from PAP use. Reported sleepiness is  improved and the Epworth Sleepiness Score is 0 out of 24. The patient does not take naps or doze during the day. The patient complains of the following: very dry mouth. It is suspected that patient is oral venting and may need a chin strap or full face mask. The compliance download shows 53%  compliance with an average use time of 4 hours 40 min The AHI is (machine doesn't report AHI).  The patient no restlessness of limb movements disrupting sleep. The patient's machine is 64 years old, and was replaced about 2 years ago but it was lost  and she had to go back to her old machine. CPAP use is medically necessary for patient to maintain healthy function.   ROS  General: (-) fever, (-) chills, (-) night sweat Nose and Sinuses: (-) nasal stuffiness or itchiness, (-) postnasal drip, (-) nosebleeds, (-) sinus trouble. Mouth and Throat: (-) sore throat, (-) hoarseness. Neck: (-) swollen glands, (-) enlarged thyroid, (-) neck pain. Respiratory: - cough, - shortness of breath, - wheezing. Neurologic: - numbness, - tingling. Psychiatric: - anxiety, - depression   Current Medication: Outpatient Encounter Medications as of 09/24/2021  Medication Sig   albuterol (VENTOLIN HFA) 108 (90 Base) MCG/ACT inhaler Inhale into the lungs.   aspirin 81 MG chewable tablet Chew 81 mg by mouth daily.   aspirin 81 MG EC tablet Take by mouth.   fluticasone (FLONASE) 50 MCG/ACT nasal spray Place 2  sprays into both nostrils as needed for allergies or rhinitis.   fluticasone (FLONASE) 50 MCG/ACT nasal spray Place into the nose.   meclizine (ANTIVERT) 25 MG tablet TAKE ONE HALF TO 1 TABLET BY MOUTH EVERY 6 HOURS AS NEEDED FOR DIZZINESS. WATCH FOR SEDATION. (Patient not taking: Reported on 10/07/2019)   naproxen (NAPROSYN) 500 MG tablet Take 1 tablet (500 mg total) by mouth 2 (two) times daily with a meal.   NON FORMULARY cpap device   simvastatin (ZOCOR) 40 MG tablet Take 40 mg by mouth daily at 6 PM.   simvastatin (ZOCOR) 40 MG tablet Take by mouth.   VENTOLIN HFA 108 (90 Base) MCG/ACT inhaler Inhale 2 puffs into the lungs every 4 (four) hours as needed for wheezing or shortness of breath.   zolpidem (AMBIEN) 10 MG tablet Take 10 mg by mouth at bedtime.   zolpidem (AMBIEN) 10 MG tablet Take by mouth.   [DISCONTINUED] busPIRone (BUSPAR) 5 MG tablet    [DISCONTINUED] busPIRone (BUSPAR) 5 MG tablet Take by mouth.   [DISCONTINUED] esomeprazole (NEXIUM) 40 MG capsule Take by mouth.   [DISCONTINUED] orlistat (XENICAL) 120 MG capsule Take by mouth.   [DISCONTINUED] phentermine (ADIPEX-P) 37.5 MG tablet Take 37.5 mg by mouth every morning.   No facility-administered encounter medications on file as of 09/24/2021.    Surgical History: Past Surgical History:  Procedure Laterality Date   ENDOBRONCHIAL ULTRASOUND N/A 12/21/2014   Procedure: ENDOBRONCHIAL ULTRASOUND;  Surgeon: Erin Fulling, MD;  Location: [facility] ORS;  Service: Cardiopulmonary;  Laterality: N/A;    Medical History: Past Medical History:  Diagnosis Date   Anxiety    GERD (gastroesophageal reflux disease)    History of panic attacks    Hyposomnia, insomnia or sleeplessness associated with anxiety    Sarcoidosis    Shortness of breath dyspnea    Sleep apnea     Family History: Non contributory to the present illness  Social History: Social History   Socioeconomic History   Marital status: Married    Spouse name: Not on  file   Number of children: Not on file   Years of education: Not on file   Highest education level: Not on file  Occupational History   Not on file  Tobacco Use   Smoking status: Never   Smokeless tobacco: Never  Vaping Use   Vaping Use: Never used  Substance and Sexual Activity   Alcohol use: No   Drug use: No   Sexual activity: Not on file  Other Topics Concern   Not on file  Social History Narrative   Not on file   Social Determinants of Health   Financial Resource Strain: Not on file  Food Insecurity: Not on file  Transportation Needs: Not on file  Physical Activity: Not on file  Stress: Not on file  Social Connections: Not on file  Intimate Partner Violence: Not on file    Vital Signs: Blood pressure (!) 153/82, pulse 70, resp. rate 12, height 5\' 4"  (1.626 m), weight 194 lb 9.6 oz (88.3 kg), SpO2 96 %. Body mass index is 33.4 kg/m.    Examination: General Appearance: The patient is well-developed, well-nourished, and in no distress. Neck Circumference: 40cm Skin: Gross inspection of skin unremarkable. Head: normocephalic, no gross deformities. Eyes: no gross deformities noted. ENT: ears appear grossly normal Neurologic: Alert and oriented. No involuntary movements.    EPWORTH SLEEPINESS SCALE:  Scale:  (0)= no chance of dozing; (1)= slight chance of dozing; (2)= moderate chance of dozing; (3)= high chance of dozing  Chance  Situtation    Sitting and reading: 0    Watching TV: 0    Sitting Inactive in public: 0    As a passenger in car: 0      Lying down to rest: 0    Sitting and talking: 0    Sitting quielty after lunch: 0    In a car, stopped in traffic: 0   TOTAL SCORE:   0 out of 24    SLEEP STUDIES:  SPLIT-02/15/12-107.6 AHI, 63.7 Centrals, Min SP02 82%- CPAP@8cmh20    CPAP COMPLIANCE DATA:  Date Range: 09/24/20-09/23/21  Average Daily Use: 4 hours 40 min  Median Use: 4 hours 09/25/21  Compliance for > 4 Hours: 53%  days  AHI:  machine doesn't report  Days Used: 321/365  Mask Leak: machine does not report leak  95th Percentile Pressure: 11         LABS: No results found for this or any previous visit (from the past 2160 hour(s)).  Radiology: MM 3D SCREEN BREAST BILATERAL  Result Date: 04/09/2021 CLINICAL DATA:  Screening. EXAM: DIGITAL SCREENING BILATERAL MAMMOGRAM WITH TOMOSYNTHESIS AND CAD TECHNIQUE: Bilateral screening digital craniocaudal and mediolateral oblique mammograms were obtained. Bilateral screening digital breast tomosynthesis was performed. The images were evaluated with computer-aided detection. COMPARISON:  Previous exam(s). ACR Breast Density Category b: There are scattered areas of fibroglandular density. FINDINGS: There are no findings suspicious for malignancy. IMPRESSION: No mammographic evidence of malignancy. A result  letter of this screening mammogram will be mailed directly to the patient. RECOMMENDATION: Screening mammogram in one year. (Code:SM-B-01Y) BI-RADS CATEGORY  1: Negative. Electronically Signed   By: Sherron Ales M.D.   On: 04/09/2021 17:09   No results found.  No results found.    Assessment and Plan: Patient Active Problem List   Diagnosis Date Noted   Obesity (BMI 30-39.9) 01/24/2020   OSA on CPAP 12/20/2019   CPAP use counseling 12/20/2019   Hyposomnia, insomnia or sleeplessness associated with anxiety    Benign essential HTN 09/22/2019   Other chest pain 09/22/2019   Hyperlipidemia, mixed 08/13/2019   Sarcoidosis    Mediastinal adenopathy 12/21/2014   1. OSA on CPAP The patient does tolerate PAP and reports  benefit from PAP use. Her machine is at end of life and must be replaced. She had a new machine 2 years ago but it was left on an airplane. She will either get a new machine if possible through insurance (she is using a 64 year old machine currently which does not report AHI or leak) or a used machine out of pocket. The patient was  reminded how to clean equipment and advised to replace supplies routinely. The patient was also counselled on weight loss. The compliance is poor. The AHI is unavailable.   OSA on cpap- replace machine. F/u 30d after setup. CPAP continues to be medically necessary to treat this patient's OSA.    2. CPAP use counseling CPAP Counseling: had a lengthy discussion with the patient regarding the importance of PAP therapy in management of the sleep apnea. Patient appears to understand the risk factor reduction and also understands the risks associated with untreated sleep apnea. Patient will try to make a good faith effort to remain compliant with therapy. Also instructed the patient on proper cleaning of the device including the water must be changed daily if possible and use of distilled water is preferred. Patient understands that the machine should be regularly cleaned with appropriate recommended cleaning solutions that do not damage the PAP machine for example given white vinegar and water rinses. Other methods such as ozone treatment may not be as good as these simple methods to achieve cleaning.      General Counseling: I have discussed the findings of the evaluation and examination with Kevin Fenton.  I have also discussed any further diagnostic evaluation thatmay be needed or ordered today. Leianna verbalizes understanding of the findings of todays visit. We also reviewed her medications today and discussed drug interactions and side effects including but not limited excessive drowsiness and altered mental states. We also discussed that there is always a risk not just to her but also people around her. she has been encouraged to call the office with any questions or concerns that should arise related to todays visit.  No orders of the defined types were placed in this encounter.       I have personally obtained a history, examined the patient, evaluated laboratory and imaging results, formulated the  assessment and plan and placed orders. This patient was seen today by Emmaline Kluver, PA-C in collaboration with Dr. Freda Munro.   Yevonne Pax, MD [facility] Diplomate ABMS Pulmonary Critical Care Medicine and Sleep Medicine

## 2021-09-24 NOTE — Patient Instructions (Signed)

## 2021-10-03 ENCOUNTER — Emergency Department
Admission: EM | Admit: 2021-10-03 | Discharge: 2021-10-04 | Disposition: A | Discharge status: Home / Self Care | Payer: BC Managed Care – PPO | Attending: Emergency Medicine | Admitting: Emergency Medicine

## 2021-10-03 ENCOUNTER — Emergency Department: Payer: BC Managed Care – PPO

## 2021-10-03 DIAGNOSIS — Z7982 Long term (current) use of aspirin: Secondary | ICD-10-CM | POA: Diagnosis not present

## 2021-10-03 DIAGNOSIS — Z79899 Other long term (current) drug therapy: Secondary | ICD-10-CM | POA: Insufficient documentation

## 2021-10-03 DIAGNOSIS — R9431 Abnormal electrocardiogram [ECG] [EKG]: Secondary | ICD-10-CM | POA: Diagnosis not present

## 2021-10-03 DIAGNOSIS — M47812 Spondylosis without myelopathy or radiculopathy, cervical region: Secondary | ICD-10-CM | POA: Diagnosis not present

## 2021-10-03 DIAGNOSIS — J321 Chronic frontal sinusitis: Secondary | ICD-10-CM | POA: Diagnosis not present

## 2021-10-03 DIAGNOSIS — R791 Abnormal coagulation profile: Secondary | ICD-10-CM | POA: Diagnosis not present

## 2021-10-03 DIAGNOSIS — R202 Paresthesia of skin: Secondary | ICD-10-CM | POA: Diagnosis not present

## 2021-10-03 DIAGNOSIS — R200 Anesthesia of skin: Secondary | ICD-10-CM | POA: Insufficient documentation

## 2021-10-03 DIAGNOSIS — I639 Cerebral infarction, unspecified: Secondary | ICD-10-CM | POA: Diagnosis not present

## 2021-10-03 DIAGNOSIS — R42 Dizziness and giddiness: Secondary | ICD-10-CM | POA: Diagnosis not present

## 2021-10-03 LAB — CBC
HCT: 36.6 % (ref 36.0–46.0)
Hemoglobin: 12.2 g/dL (ref 12.0–15.0)
MCH: 28.9 pg (ref 26.0–34.0)
MCHC: 33.3 g/dL (ref 30.0–36.0)
MCV: 86.7 fL (ref 80.0–100.0)
Platelets: 192 10*3/uL (ref 150–400)
RBC: 4.22 MIL/uL (ref 3.87–5.11)
RDW: 12.8 % (ref 11.5–15.5)
WBC: 6.7 10*3/uL (ref 4.0–10.5)
nRBC: 0 % (ref 0.0–0.2)

## 2021-10-03 LAB — DIFFERENTIAL
Abs Immature Granulocytes: 0.01 10*3/uL (ref 0.00–0.07)
Basophils Absolute: 0 10*3/uL (ref 0.0–0.1)
Basophils Relative: 0 %
Eosinophils Absolute: 0.1 10*3/uL (ref 0.0–0.5)
Eosinophils Relative: 2 %
Immature Granulocytes: 0 %
Lymphocytes Relative: 50 %
Lymphs Abs: 3.3 10*3/uL (ref 0.7–4.0)
Monocytes Absolute: 0.6 10*3/uL (ref 0.1–1.0)
Monocytes Relative: 9 %
Neutro Abs: 2.6 10*3/uL (ref 1.7–7.7)
Neutrophils Relative %: 39 %

## 2021-10-03 LAB — COMPREHENSIVE METABOLIC PANEL
ALT: 20 U/L (ref 0–44)
AST: 27 U/L (ref 15–41)
Albumin: 4 g/dL (ref 3.5–5.0)
Alkaline Phosphatase: 58 U/L (ref 38–126)
Anion gap: 7 (ref 5–15)
BUN: 21 mg/dL (ref 8–23)
CO2: 26 mmol/L (ref 22–32)
Calcium: 9.2 mg/dL (ref 8.9–10.3)
Chloride: 107 mmol/L (ref 98–111)
Creatinine, Ser: 0.91 mg/dL (ref 0.44–1.00)
GFR, Estimated: >60 mL/min (ref >60)
Glucose, Bld: 117 mg/dL — ABNORMAL HIGH (ref 70–99)
Potassium: 4.1 mmol/L (ref 3.5–5.1)
Sodium: 140 mmol/L (ref 135–145)
Total Bilirubin: 0.5 mg/dL (ref 0.3–1.2)
Total Protein: 7.6 g/dL (ref 6.5–8.1)

## 2021-10-03 LAB — PROTIME-INR
INR: 1 (ref 0.8–1.2)
Prothrombin Time: 12.9 seconds (ref 11.4–15.2)

## 2021-10-03 LAB — APTT: aPTT: 33 seconds (ref 24–36)

## 2021-10-03 LAB — CBG MONITORING, ED: Glucose-Capillary: 109 mg/dL — ABNORMAL HIGH (ref 70–99)

## 2021-10-03 LAB — ETHANOL: Alcohol, Ethyl (B): <10 mg/dL (ref <10)

## 2021-10-03 NOTE — ED Provider Notes (Addendum)
[facility] Provider Note    Event Date/Time   First MD Initiated Contact with Patient 10/03/21 2259     (approximate)   History   Numbness and facial numbness   HPI  Valerie Yu is a 66 y.o. female with history of sarcoidosis, hyperlipidemia who presents to the emergency department with left facial and left arm numbness that started today at work.  She also felt dizzy and "woozy" and off balance.  Feels like her symptoms are improving but not resolved.  She has been seen previously by neurology for intermittent episodes of numbness in the left side of the face and dizziness.  Last visit was May 2022.  No previous history of stroke.  Last known well was 7 PM.  She states she has had some intermittent numbness in her hands when she wakes up in the morning and some aching in the left wrist but tonight she became concerned when she had tingling that moved up her forearm and into her upper arm which she states is new for her.  No head injury, headache.  No neck pain or neck stiffness.  She is followed by pulmonology for her sarcoidosis and is not on chronic medication for this.  She denies any chest pain or shortness of breath.  She did take an aspirin prior to arrival.   History provided by patient.    Past Medical History:  Diagnosis Date   Anxiety    GERD (gastroesophageal reflux disease)    History of panic attacks    Hyposomnia, insomnia or sleeplessness associated with anxiety    Sarcoidosis    Shortness of breath dyspnea    Sleep apnea     Past Surgical History:  Procedure Laterality Date   ENDOBRONCHIAL ULTRASOUND N/A 12/21/2014   Procedure: ENDOBRONCHIAL ULTRASOUND;  Surgeon: Erin Fulling, MD;  Location: [facility] ORS;  Service: Cardiopulmonary;  Laterality: N/A;    MEDICATIONS:  Prior to Admission medications   Medication Sig Start Date End Date Taking? Authorizing Provider  albuterol (VENTOLIN HFA) 108 (90 Base) MCG/ACT inhaler Inhale into  the lungs. 02/08/15 02/13/21  [provider]  aspirin 81 MG chewable tablet Chew 81 mg by mouth daily.    [provider]  aspirin 81 MG EC tablet Take by mouth. 07/27/16   [provider]  fluticasone (FLONASE) 50 MCG/ACT nasal spray Place 2 sprays into both nostrils as needed for allergies or rhinitis.    [provider]  fluticasone (FLONASE) 50 MCG/ACT nasal spray Place into the nose. 02/08/15   [provider]  meclizine (ANTIVERT) 25 MG tablet TAKE ONE HALF TO 1 TABLET BY MOUTH EVERY 6 HOURS AS NEEDED FOR DIZZINESS. WATCH FOR SEDATION. Patient not taking: Reported on 10/07/2019 06/09/18   [provider]  naproxen (NAPROSYN) 500 MG tablet Take 1 tablet (500 mg total) by mouth 2 (two) times daily with a meal. 10/07/19   Scarboro, Coralee North, NP  NON FORMULARY cpap device    [provider]  simvastatin (ZOCOR) 40 MG tablet Take 40 mg by mouth daily at 6 PM.    [provider]  simvastatin (ZOCOR) 40 MG tablet Take by mouth. 08/09/14   [provider]  VENTOLIN HFA 108 (90 Base) MCG/ACT inhaler Inhale 2 puffs into the lungs every 4 (four) hours as needed for wheezing or shortness of breath. 10/01/18   Johnna Acosta, NP  zolpidem (AMBIEN) 10 MG tablet Take 10 mg by mouth at bedtime.  [provider]  zolpidem (AMBIEN) 10 MG tablet Take by mouth. 08/09/14   [provider]    Physical Exam   Triage Vital Signs: ED Triage Vitals  Enc Vitals Group     BP 10/03/21 2207 (!) 176/104     Pulse Rate 10/03/21 2207 71     Resp 10/03/21 2207 17     Temp 10/03/21 2207 98.7 F (37.1 C)     Temp Source 10/03/21 2207 Oral     SpO2 10/03/21 2207 97 %     Weight 10/03/21 2207 194 lb (88 kg)     Height 10/03/21 2207 5\' 4"  (1.626 m)     Head Circumference --      Peak Flow --      Pain Score 10/03/21 2238 0     Pain Loc --      Pain Edu? --      Excl. in GC? --     Most recent vital signs: Vitals:    10/03/21 2330 10/04/21 0215  BP: (!) 147/73 (!) 147/73  Pulse: 73 63  Resp: 20 13  Temp:  98.7 F (37.1 C)  SpO2: 100% 99%    CONSTITUTIONAL: Alert and oriented and responds appropriately to questions. Well-appearing; well-nourished HEAD: Normocephalic, atraumatic EYES: Conjunctivae clear, pupils appear equal, sclera nonicteric ENT: normal nose; moist mucous membranes NECK: Supple, normal ROM CARD: RRR; S1 and S2 appreciated; no murmurs, no clicks, no rubs, no gallops RESP: Normal chest excursion without splinting or tachypnea; breath sounds clear and equal bilaterally; no wheezes, no rhonchi, no rales, no hypoxia or respiratory distress, speaking full sentences ABD/GI: Normal bowel sounds; non-distended; soft, non-tender, no rebound, no guarding, no peritoneal signs BACK: The back appears normal EXT: Normal ROM in all joints; no deformity noted, no edema; no cyanosis SKIN: Normal color for age and race; warm; no rash on exposed skin NEURO: Moves all extremities equally, normal speech, no drift, no dysmetria to finger-nose testing, normal visual fields, cranial nerves II through XII intact, states she feels her sensation is normal diffusely PSYCH: The patient's mood and manner are appropriate.   ED Results / Procedures / Treatments   LABS: (all labs ordered are listed, but only abnormal results are displayed) Labs Reviewed  COMPREHENSIVE METABOLIC PANEL - Abnormal; Notable for the following components:      Result Value   Glucose, Bld 117 (*)    All other components within normal limits  URINALYSIS, ROUTINE W REFLEX MICROSCOPIC - Abnormal; Notable for the following components:   Color, Urine STRAW (*)    APPearance CLEAR (*)    All other components within normal limits  CBG MONITORING, ED - Abnormal; Notable for the following components:   Glucose-Capillary 109 (*)    All other components within normal limits  ETHANOL  PROTIME-INR  APTT  CBC  DIFFERENTIAL  URINE DRUG  SCREEN, QUALITATIVE ([facility] ONLY)     EKG:  EKG Interpretation  Date/Time:  Wednesday October 03 2021 22:12:56 EDT Ventricular Rate:  82 PR Interval:  166 QRS Duration: 80 QT Interval:  376 QTC Calculation: 439 R Axis:   -15 Text Interpretation: Normal sinus rhythm Low voltage QRS Borderline ECG When compared with ECG of 08-Apr-2021 13:23, No significant change was found Confirmed by Rochele Raring [phone]) on 10/03/2021 11:00:52 PM         RADIOLOGY: My personal review and interpretation of imaging: MRI showed no acute abnormality.  I have personally reviewed all radiology reports.   MR  Cervical Spine W or Wo Contrast  Result Date: 10/04/2021 CLINICAL DATA:  Initial evaluation for acute left arm and facial numbness. EXAM: MRI CERVICAL SPINE WITHOUT AND WITH CONTRAST TECHNIQUE: Multiplanar and multiecho pulse sequences of the cervical spine, to include the craniocervical junction and cervicothoracic junction, were obtained without and with intravenous contrast. CONTRAST:  8mL GADAVIST GADOBUTROL 1 MMOL/ML IV SOLN COMPARISON:  None Available. FINDINGS: Alignment: Straightening of the normal cervical lordosis. No significant listhesis. Vertebrae: Vertebral body height maintained without acute or chronic fracture. Bone marrow signal intensity mildly heterogeneous but overall within normal limits. No discrete or worrisome osseous lesions. No abnormal marrow edema or enhancement. Cord: Normal signal and morphology.  No abnormal enhancement. Posterior Fossa, vertebral arteries, paraspinal tissues: Craniocervical junction normal. Paraspinous soft tissues within normal limits. Normal flow voids seen within the vertebral arteries bilaterally. 8 mm right thyroid nodule noted, of doubtful significance given size and patient age, no follow-up imaging recommended (ref: J Am Coll Radiol. 2015 Feb;12(2): 143-50). Disc levels: C2-C3: Negative interspace. Minimal facet spurring. No canal or foraminal stenosis.  C3-C4: Tiny central disc protrusion minimally indents the ventral thecal sac. Associated mild bilateral uncovertebral and facet hypertrophy. No significant spinal stenosis. Mild to moderate right with mild left C4 foraminal stenosis. C4-C5: Minimal disc bulge with left greater than right uncovertebral spurring. Mild l facet hypertrophy, also greater on the left. No significant spinal stenosis. Moderate left C5 foraminal stenosis. Right neural foramen remains patent. C5-C6: Minimal disc bulge with uncovertebral spurring. No significant spinal stenosis. Mild bilateral C6 foraminal stenosis, slightly worse on the left. C6-C7: Minimal disc bulge with endplate and uncovertebral spurring. No canal or foraminal stenosis. C7-T1: Negative interspace. Mild facet and ligament flavum hypertrophy, greater on the left. No significant spinal stenosis. Foramina remain patent. Visualized upper thoracic spine demonstrates no significant finding. IMPRESSION: 1. No acute abnormality within the cervical spine or spinal cord. 2. Mild multilevel cervical spondylosis for age without significant spinal stenosis or overt neural impingement. 3. Multifactorial degenerative changes with resultant multilevel foraminal narrowing as above. Notable findings include mild to moderate right C4 foraminal stenosis, moderate left C5 foraminal narrowing, with mild bilateral C6 foraminal narrowing. Electronically Signed   By: Rise Mu M.D.   On: 10/04/2021 02:11   MR Brain W and Wo Contrast  Result Date: 10/04/2021 CLINICAL DATA:  Initial evaluation for neuro deficit, stroke suspected. EXAM: MRI HEAD WITHOUT AND WITH CONTRAST TECHNIQUE: Multiplanar, multiecho pulse sequences of the brain and surrounding structures were obtained without and with intravenous contrast. CONTRAST:  8mL GADAVIST GADOBUTROL 1 MMOL/ML IV SOLN COMPARISON:  Prior CT from 10/03/2021 and MRI from 03/11/2019. FINDINGS: Brain: Cerebral volume within normal limits. Few  scattered punctate foci of T2/FLAIR hyperintensity noted involving the supratentorial cerebral white matter, nonspecific, but overall minimal in nature, and felt to be within normal limits for age. Small remote right cerebellar infarct noted (series 10, image 6). No evidence for acute or subacute ischemia. Gray-white matter differentiation otherwise maintained. No other areas of chronic cortical infarction. No acute or chronic intracranial blood products. No mass lesion, midline shift or mass effect. No hydrocephalus or extra-axial fluid collection. Pituitary gland and suprasellar region within normal limits. No abnormal enhancement. Vascular: Major intracranial vascular flow voids are maintained. Skull and upper cervical spine: Craniocervical junction within normal limits. Bone marrow signal intensity normal. No scalp soft tissue abnormality. Sinuses/Orbits: Globes and orbital soft tissues within normal limits. Chronic frontal sinusitis noted. Paranasal sinuses are otherwise largely clear. Trace right  mastoid effusion noted, of doubtful significance. Other: None. IMPRESSION: 1. No acute intracranial abnormality. 2. Small remote right cerebellar infarct. Otherwise normal brain MRI for patient age. 3. Chronic frontal sinusitis. Electronically Signed   By: Rise Mu M.D.   On: 10/04/2021 02:03   CT HEAD CODE STROKE WO CONTRAST  Result Date: 10/03/2021 CLINICAL DATA:  Code stroke. Initial evaluation for neuro deficit, stroke suspected. EXAM: CT HEAD WITHOUT CONTRAST TECHNIQUE: Contiguous axial images were obtained from the base of the skull through the vertex without intravenous contrast. RADIATION DOSE REDUCTION: This exam was performed according to the departmental dose-optimization program which includes automated exposure control, adjustment of the mA and/or kV according to patient size and/or use of iterative reconstruction technique. COMPARISON:  Comparison made with prior MRI from 10/09/2019.  FINDINGS: Brain: Cerebral volume within normal limits. No acute intracranial hemorrhage. No acute large vessel territory infarct. No mass lesion, midline shift or mass effect. No hydrocephalus or extra-axial fluid collection. Vascular: No abnormal hyperdense vessel. Skull: Scalp soft tissues and calvarium within normal limits. Sinuses/Orbits: Globes orbital soft tissues within normal limits. Chronic frontal sinusitis noted. No mastoid effusion. Other: None. ASPECTS [facility] Stroke Program Early CT Score) - Ganglionic level infarction (caudate, lentiform nuclei, internal capsule, insula, M1-M3 cortex): 7 - Supraganglionic infarction (M4-M6 cortex): 3 Total score (0-10 with 10 being normal): 10 IMPRESSION: 1. No acute intracranial abnormality. 2. ASPECTS is 10. 3. Chronic frontal sinusitis. Results were called by telephone at the time of interpretation on 10/03/2021 at 10:42 pm to provider [facility] , who verbally acknowledged these results. Electronically Signed   By: Rise Mu M.D.   On: 10/03/2021 22:51     PROCEDURES:  Critical Care performed: Yes, see critical care procedure note(s)   CRITICAL CARE Performed by: Baxter Hire Jakylan Ron   Total critical care time: 40 minutes  Critical care time was exclusive of separately billable procedures and treating other patients.  Critical care was necessary to treat or prevent imminent or life-threatening deterioration.  Critical care was time spent personally by me on the following activities: development of treatment plan with patient and/or surrogate as well as nursing, discussions with consultants, evaluation of patient's response to treatment, examination of patient, obtaining history from patient or surrogate, ordering and performing treatments and interventions, ordering and review of laboratory studies, ordering and review of radiographic studies, pulse oximetry and re-evaluation of patient's condition.   Marland Kitchen1-3 Lead EKG  Interpretation  Performed by: Punam Broussard, Layla Maw, DO Authorized by: Kaitrin Seybold, Layla Maw, DO     Interpretation: normal     ECG rate:  67   ECG rate assessment: normal     Rhythm: sinus rhythm     Ectopy: none     Conduction: normal       IMPRESSION / MDM / ASSESSMENT AND PLAN / ED COURSE  I reviewed the triage vital signs and the nursing notes.    Patient here with left-sided facial and arm numbness that started today.  Code stroke has been initiated.  The patient is on the cardiac monitor to evaluate for evidence of arrhythmia and/or significant heart rate changes.   DIFFERENTIAL DIAGNOSIS (includes but not limited to):   CVA, TIA, intracranial hemorrhage, mass, carpal tunnel syndrome, complex migraine, seizure   Patient's presentation is most consistent with acute presentation with potential threat to life or bodily function.   PLAN: Will obtain CBC, CMP, CT head, urinalysis.  Patient will be seen by neurology for further recommendations.  Code stroke was  activated by previous provider.   MEDICATIONS GIVEN IN ED: Medications  gadobutrol (GADAVIST) 1 MMOL/ML injection 8 mL (8 mLs Intravenous Contrast Given 10/04/21 0046)     ED COURSE: Labs show normal glucose.  No leukocytosis, normal hemoglobin, normal electrolytes, LFTs and renal function.  Head CT reviewed and interpreted by myself and the radiologist and shows chronic frontal sinusitis but no acute intracranial abnormality.  Patient will be seen by neurology for further recommendations.   CONSULTS:  11:22 PM  Spoke with Dr. Rexford Maus with tele neurology.  She does not feel that this was a stroke and less likely a TIA.  Not a TNK candidate given very mild symptoms and NIH stroke scale of 0 on my exam.  Patient has been seen by neurology as an outpatient before for the dizziness and left-sided facial numbness but she tells me that the numbness that went up the left arm is new for her.  She does have intermittent numbness in both  hands and aching in the left wrist when she wakes up in the morning and I agree with the neurologist that this is likely carpal tunnel.  Discussed with patient that she could have an MRI of her brain and cervical spine with and without contrast while here in the ED or as an outpatient.  Patient would like to proceed with MRI now.     OUTSIDE RECORDS REVIEWED: Reviewed patient's last neurology note with Dr. Sherryll Burger in May 2022.   2:18 AM  Pt's MRI is reviewed and interpreted by myself and the radiologist and show no acute abnormality.  Urine shows no sign of infection and drug screen is negative.  Given that it looks like she has been worked up previously for this ataxia, dizziness and left-sided facial numbness, I feel these are chronic intermittent issues.  She has even had an MRI of her trigeminal nerve previously.  She states that they are numbness of the left arm is new for her but there is no acute infarct seen on imaging and no other lesion of the brain.  Recommended close follow-up with her neurologist.  Patient had told neurologist earlier that she was hoping to get an answer of why this continues to happen to her but explained to patient the role of the emergency department and that we do not see any emergent condition present and recommend close outpatient follow-up for further outpatient management.  She is requesting a medication to help with these episodes of intermittent numbness but explained to her that she would need further workup as an outpatient to determine what condition she truly has before medications will be provided.  I do not feel medications like Lyrica or gabapentin would be helpful as she does not have true numbness on exam or neuropathy.  The neurologist did talk to her that if she continues to have numbness in the left arm that an EMG may be performed as an outpatient.  Meclizine would be an option for her dizziness but she has no dysmetria and has normal gait here and is not  describing any vertiginous symptoms.  We did discuss that her MRI showed a previous right cerebellar infarct that could cause her intermittent dizziness but I do not think it is causing her facial and arm numbness today.  She has establish care with Dr. Sherryll Burger with neurology and was last seen by them a year ago.  Have encouraged her to call make an appointment in the morning.  I have reiterated what neurology has  already recommended by using a brace for carpal tunnel to the left wrist.   At this time, I do not feel there is any life-threatening condition present. I reviewed all nursing notes, vitals, pertinent previous records.  All lab and urine results, EKGs, imaging ordered have been independently reviewed and interpreted by myself.  I reviewed all available radiology reports from any imaging ordered this visit.  Based on my assessment, I feel the patient is safe to be discharged home without further emergent workup and can continue workup as an outpatient as needed. Discussed all findings, treatment plan as well as usual and customary return precautions.  They verbalize understanding and are comfortable with this plan.  Outpatient follow-up has been provided as needed.  All questions have been answered.     FINAL CLINICAL IMPRESSION(S) / ED DIAGNOSES   Final diagnoses:  Left sided numbness     Rx / DC Orders   ED Discharge Orders          Ordered    meclizine (ANTIVERT) 25 MG tablet  3 times daily PRN        10/04/21 0245             Note:  This document was prepared using Dragon voice recognition software and may include unintentional dictation errors.   Latarsha Zani, Layla Maw, DO 10/04/21 0242    Luan Urbani, Layla Maw, DO 10/04/21 1027

## 2021-10-03 NOTE — ED Triage Notes (Signed)
Pt arrived to ED with the complaint of left sided facial numbness that goes to her left arm. RN called Goodman,MD about pt s/s and he advised to call a code stroke. LKW was at 1900 today.

## 2021-10-03 NOTE — ED Notes (Signed)
Pt to MRI

## 2021-10-03 NOTE — ED Notes (Signed)
Activated Code STROKE

## 2021-10-03 NOTE — Consult Note (Signed)
[facility] Consult Services   Patient Name:   Valerie Yu, Valerie Yu Date of Birth:   1955/07/03 Identification Number:   MRN - [id number] Date of Service:   10/03/2021 22:36:29  Diagnosis:       R20.2 - Paresthesia of skin  Impression:      L facial tingling and L hand tingling off and on actually for years as per chart review- had MRI for this in 2018    Not a stroke as per symptom timeline    Recommend MR brain and C spine with and without to rule out some sort of neurosarcoid manifestation- this could be completed w close outpt follow up, she has several documented prior imaging scans none of which have revealed enhancement    outpt EMG for carpal tunnel  Recommend wearing wrist brace on L        Neuro Checks       Bedside Swallow Eval       DVT Prophylaxis       IV Fluids, Normal Saline       Head of Bed 30 Degrees       Euglycemia and Avoid Hyperthermia (PRN Acetaminophen)       Initiate or continue Aspirin 81 MG daily   Sign Out:       Discussed with Emergency Department Provider    ------------------------------------------------------------------------------  Advanced Imaging: Advanced Imaging Deferred because:  Stroke not suspected with clinical presentation and exam   Metrics: Last Known Well: 10/03/2021 19:00:00 [facility] Notification Time: 10/03/2021 22:36:29 Arrival Time: 10/03/2021 21:52:00 Stamp Time: 10/03/2021 22:36:29 Initial Response Time: 10/03/2021 22:48:00 Symptoms: L face/L hand numbness. Initial patient interaction: 10/03/2021 23:00:12 NIHSS Assessment Completed: 10/03/2021 23:01:22 Patient is not a candidate for Thrombolytic. Thrombolytic Medical Decision: 10/03/2021 23:01:23 Patient was not deemed candidate for Thrombolytic because of following reasons: No disabling symptoms.  CT head showed no acute hemorrhage or acute core infarct.  Primary Provider Notified of Diagnostic Impression and  Management Plan on: 10/03/2021 23:22:41    ------------------------------------------------------------------------------  History of Present Illness: Patient is a 66 year old Female.  Patient was brought by private transportation with symptoms of L face/L hand numbness. 7pm while at work noted L face and L arm numbness. Tonight felt like her L cheek was swollen, L eye was going to close, lip tingling. Notes numbness/tingling from L elbow to wrist, associated with pain in L shoulder. Tried to lay down, then felt a little unsteady tonight had to hold onto the rail while at work, her coworkers noticed this. notes a bit of "brain freeze" sensation and chills. 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 Some associated chest pain as well. She will sometimes wake up with the L hand symptoms, L wrist discomfort. R ear "crust" this AM but no L tinnitus or hearing loss.   Past Medical History: [place] PMH:  Sarcoidosis, sleep apnea  Medications:  No Anticoagulant use  Antiplatelet use: Yes asa 81 Reviewed EMR for current medications Other Medications Pertinent To Assessment Include: takes ambien 10mg  nightly  Allergies:  Reviewed  Social History: Alcohol Use: No Drug Use: No  Family History:  There is no family history of premature cerebrovascular disease pertinent to this consultation  ROS : 14 Points Review of Systems was performed and was negative except mentioned in HPI.  Past Surgical History: There Is No Surgical History Contributory To Today's Visit    Examination: BP(177/95), Pulse(75), 1A: Level  of Consciousness - Alert; keenly responsive + 0 1B: Ask Month and Age - Both Questions Right + 0 1C: Blink Eyes & Squeeze Hands - Performs Both Tasks + 0 2: Test Horizontal Extraocular Movements - Normal + 0 3: Test Visual Fields - No Visual Loss + 0 4: Test Facial Palsy (Use Grimace if Obtunded) - Normal symmetry + 0 5A: Test Left Arm  Motor Drift - No Drift for 10 Seconds + 0 5B: Test Right Arm Motor Drift - No Drift for 10 Seconds + 0 6A: Test Left Leg Motor Drift - No Drift for 5 Seconds + 0 6B: Test Right Leg Motor Drift - No Drift for 5 Seconds + 0 7: Test Limb Ataxia (FNF/Heel-Shin) - No Ataxia + 0 8: Test Sensation - Normal; No sensory loss + 0 9: Test Language/Aphasia - Normal; No aphasia + 0 10: Test Dysarthria - Normal + 0 11: Test Extinction/Inattention - No abnormality + 0  NIHSS Score: 0   Pre-Morbid Modified Rankin Scale: 0 Points = No symptoms at all   Patient/Family was informed the Neurology Consult would occur via TeleHealth consult by way of interactive audio and video telecommunications and consented to receiving care in this manner.   Patient is being evaluated for possible acute neurologic impairment and high probability of imminent or life-threatening deterioration. I spent total of 34 minutes providing care to this patient, including time for face to face visit via telemedicine, review of medical records, imaging studies and discussion of findings with providers, the patient and/or family.   Dr Pamalee Leyden   [facility] For Inpatient follow-up with [facility] physician please call [facility] [phone]. This is not an outpatient service. Post hospital discharge, please contact hospital directly.

## 2021-10-03 NOTE — Progress Notes (Signed)
Code stroke activated at 2232. CT already completed. TS paged at 2236. Dr Ruthell Rummage on screen at 2300-results of head CT communicated. Ricci Barker, Multimedia programmer

## 2021-10-03 NOTE — ED Notes (Signed)
Code Stroke RN at bedside at this time. Tele-Neuro cart

## 2021-10-04 DIAGNOSIS — I639 Cerebral infarction, unspecified: Secondary | ICD-10-CM | POA: Diagnosis not present

## 2021-10-04 DIAGNOSIS — J321 Chronic frontal sinusitis: Secondary | ICD-10-CM | POA: Diagnosis not present

## 2021-10-04 DIAGNOSIS — M47812 Spondylosis without myelopathy or radiculopathy, cervical region: Secondary | ICD-10-CM | POA: Diagnosis not present

## 2021-10-04 DIAGNOSIS — R202 Paresthesia of skin: Secondary | ICD-10-CM | POA: Diagnosis not present

## 2021-10-04 LAB — URINALYSIS, ROUTINE W REFLEX MICROSCOPIC
Bilirubin Urine: NEGATIVE
Glucose, UA: NEGATIVE mg/dL
Hgb urine dipstick: NEGATIVE
Ketones, ur: NEGATIVE mg/dL
Leukocytes,Ua: NEGATIVE
Nitrite: NEGATIVE
Protein, ur: NEGATIVE mg/dL
Specific Gravity, Urine: 1.01 (ref 1.005–1.030)
pH: 6 (ref 5.0–8.0)

## 2021-10-04 LAB — URINE DRUG SCREEN, QUALITATIVE (ARMC ONLY)
Amphetamines, Ur Screen: NOT DETECTED
Barbiturates, Ur Screen: NOT DETECTED
Benzodiazepine, Ur Scrn: NOT DETECTED
Cannabinoid 50 Ng, Ur ~~LOC~~: NOT DETECTED
Cocaine Metabolite,Ur ~~LOC~~: NOT DETECTED
MDMA (Ecstasy)Ur Screen: NOT DETECTED
Methadone Scn, Ur: NOT DETECTED
Opiate, Ur Screen: NOT DETECTED
Phencyclidine (PCP) Ur S: NOT DETECTED
Tricyclic, Ur Screen: NOT DETECTED

## 2021-10-04 MED ORDER — MECLIZINE HCL 25 MG PO TABS: 25.0000 mg | ORAL_TABLET | Freq: Three times a day (TID) | ORAL | 0 refills | Status: DC | PRN

## 2021-10-04 MED ORDER — GADOBUTROL 1 MMOL/ML IV SOLN
8.0000 mL | Freq: Once | INTRAVENOUS | Status: AC | PRN
  Administered 2021-10-04: 8 mL via INTRAVENOUS

## 2021-10-04 NOTE — Discharge Instructions (Signed)
You were seen in the emergency department for left-sided facial numbness and arm numbness.  MRIs of your brain and cervical spine showed no acute abnormality.  I recommend you continue to follow-up with your neurologist as an outpatient.

## 2021-10-04 NOTE — ED Notes (Signed)
E-signature not working at this time. Pt verbalized understanding of D/C instructions, prescriptions and follow up care with no further questions at this time. Pt in NAD and ambulatory at time of D/C.  

## 2021-10-09 DIAGNOSIS — R209 Unspecified disturbances of skin sensation: Secondary | ICD-10-CM | POA: Diagnosis not present

## 2021-10-09 DIAGNOSIS — Z1389 Encounter for screening for other disorder: Secondary | ICD-10-CM | POA: Diagnosis not present

## 2021-10-17 DIAGNOSIS — G4733 Obstructive sleep apnea (adult) (pediatric): Secondary | ICD-10-CM | POA: Diagnosis not present

## 2021-10-18 DIAGNOSIS — G4733 Obstructive sleep apnea (adult) (pediatric): Secondary | ICD-10-CM | POA: Diagnosis not present

## 2021-10-18 DIAGNOSIS — G4700 Insomnia, unspecified: Secondary | ICD-10-CM | POA: Diagnosis not present

## 2021-10-18 DIAGNOSIS — R200 Anesthesia of skin: Secondary | ICD-10-CM | POA: Diagnosis not present

## 2021-10-18 DIAGNOSIS — R42 Dizziness and giddiness: Secondary | ICD-10-CM | POA: Diagnosis not present

## 2021-11-17 DIAGNOSIS — G4733 Obstructive sleep apnea (adult) (pediatric): Secondary | ICD-10-CM | POA: Diagnosis not present

## 2021-12-17 DIAGNOSIS — G4733 Obstructive sleep apnea (adult) (pediatric): Secondary | ICD-10-CM | POA: Diagnosis not present

## 2021-12-30 NOTE — Progress Notes (Signed)
Valerie Yu [address] [place], [place] [address]  Pulmonary Sleep Medicine   Office Visit Note  Patient Name: Valerie Yu DOB: 12-Dec-1955 MRN [id number]    Chief Complaint: Obstructive Sleep Apnea visit  Brief History:  [name] is seen today for a follow up after set up on CPAP@10cmh20 .  This is a replacement machine for patient. The patient has a 10 year  history of sleep apnea. Patient is using PAP nightly.  The patient feels rested after sleeping with PAP.  The patient reports benefiting from PAP use. Reported sleepiness is improved and the Epworth Sleepiness Score is 0 out of 24. The patient does not take naps. The patient complains of the following: some occasional mask leak, where the patient feels the pressure is a little too high. Some dry mouth, we reviewed how adjust the humidity settings.   The compliance download shows 70% compliance with an average use time of 5.5 hours. The AHI is 0.9  The patient occasionally complains of limb movements disrupting sleep.  ROS  General: (-) fever, (-) chills, (-) night sweat Nose and Sinuses: (-) nasal stuffiness or itchiness, (-) postnasal drip, (-) nosebleeds, (-) sinus trouble. Mouth and Throat: (-) sore throat, (-) hoarseness. Neck: (-) swollen glands, (-) enlarged thyroid, (-) neck pain. Respiratory: +cough, - shortness of breath, - wheezing. Neurologic: - numbness, - tingling. Psychiatric: - anxiety, - depression   Current Medication: Outpatient Encounter Medications as of 12/31/2021  Medication Sig   albuterol (VENTOLIN HFA) 108 (90 Base) MCG/ACT inhaler Inhale into the lungs.   aspirin 81 MG chewable tablet Chew 81 mg by mouth daily.   aspirin 81 MG EC tablet Take by mouth.   fluticasone (FLONASE) 50 MCG/ACT nasal spray Place 2 sprays into both nostrils as needed for allergies or rhinitis.   fluticasone (FLONASE) 50 MCG/ACT nasal spray Place into the nose.   meclizine (ANTIVERT) 25 MG tablet Take 1  tablet (25 mg total) by mouth 3 (three) times daily as needed for dizziness.   naproxen (NAPROSYN) 500 MG tablet Take 1 tablet (500 mg total) by mouth 2 (two) times daily with a meal.   NON FORMULARY cpap device   simvastatin (ZOCOR) 40 MG tablet Take 40 mg by mouth daily at 6 PM.   simvastatin (ZOCOR) 40 MG tablet Take by mouth.   VENTOLIN HFA 108 (90 Base) MCG/ACT inhaler Inhale 2 puffs into the lungs every 4 (four) hours as needed for wheezing or shortness of breath.   zolpidem (AMBIEN) 10 MG tablet Take 10 mg by mouth at bedtime.   zolpidem (AMBIEN) 10 MG tablet Take by mouth.   No facility-administered encounter medications on file as of 12/31/2021.    Surgical History: Past Surgical History:  Procedure Laterality Date   ENDOBRONCHIAL ULTRASOUND N/A 12/21/2014   Procedure: ENDOBRONCHIAL ULTRASOUND;  Surgeon: Erin Fulling, MD;  Location: [facility] ORS;  Service: Cardiopulmonary;  Laterality: N/A;    Medical History: Past Medical History:  Diagnosis Date   Anxiety    GERD (gastroesophageal reflux disease)    History of panic attacks    Hyposomnia, insomnia or sleeplessness associated with anxiety    Sarcoidosis    Shortness of breath dyspnea    Sleep apnea     Family History: Non contributory to the present illness  Social History: Social History   Socioeconomic History   Marital status: Married    Spouse name: Not on file   Number of children: Not on file   Years of  education: Not on file   Highest education level: Not on file  Occupational History   Not on file  Tobacco Use   Smoking status: Never   Smokeless tobacco: Never  Vaping Use   Vaping Use: Never used  Substance and Sexual Activity   Alcohol use: No   Drug use: No   Sexual activity: Not on file  Other Topics Concern   Not on file  Social History Narrative   Not on file   Social Determinants of Health   Financial Resource Strain: Not on file  Food Insecurity: Not on file  Transportation Needs: Not  on file  Physical Activity: Not on file  Stress: Not on file  Social Connections: Not on file  Intimate Partner Violence: Not on file    Vital Signs: There were no vitals taken for this visit. There is no height or weight on file to calculate BMI.    Examination: General Appearance: The patient is well-developed, well-nourished, and in no distress. Neck Circumference: 37 cm Skin: Gross inspection of skin unremarkable. Head: normocephalic, no gross deformities. Eyes: no gross deformities noted. ENT: ears appear grossly normal Neurologic: Alert and oriented. No involuntary movements.  STOP BANG RISK ASSESSMENT S (snore) Have you been told that you snore?     NO   T (tired) Are you often tired, fatigued, or sleepy during the day?   NO  O (obstruction) Do you stop breathing, choke, or gasp during sleep? NO   P (pressure) Do you have or are you being treated for high blood pressure? NO   B (BMI) Is your body index greater than 35 kg/m? NO   A (age) Are you 64 years old or older? YES   N (neck) Do you have a neck circumference greater than 16 inches?   NO   G (gender) Are you a female? NO   TOTAL STOP/BANG "YES" ANSWERS 1       A STOP-Bang score of 2 or less is considered low risk, and a score of 5 or more is high risk for having either moderate or severe OSA. For people who score 3 or 4, doctors may need to perform further assessment to determine how likely they are to have OSA.         EPWORTH SLEEPINESS SCALE:  Scale:  (0)= no chance of dozing; (1)= slight chance of dozing; (2)= moderate chance of dozing; (3)= high chance of dozing  Chance  Situtation    Sitting and reading: 0    Watching TV: 0    Sitting Inactive in public: 0    As a passenger in car: 0      Lying down to rest: 0    Sitting and talking: 0    Sitting quielty after lunch: 0    In a car, stopped in traffic: 0   TOTAL SCORE:   0 out of 24    SLEEP STUDIES:  SPLIT-02/15/12-107.6 AHI,  63.7 Centrals, Min SP02 82%- CPAP@8cmh20  CPAP Titration ( 08/19/2014)- CPAP@11  recommended    CPAP COMPLIANCE DATA:  Date Range: 11/29/21-12/28/21   Average Daily Use: 5.5 hours  Median Use: 5 hrs 58 min  Compliance for > 4 Hours: 70% days  AHI: 0.9 respiratory events per hour  Days Used: 27/30  Mask Leak: 21.2  95th Percentile Pressure: 10         LABS: Recent Results (from the past 2160 hour(s))  Ethanol     Status: None   Collection Time: 10/03/21  10:18 PM  Result Value Ref Range   Alcohol, Ethyl (B) <10 <10 mg/dL    Comment: (NOTE) Lowest detectable limit for serum alcohol is 10 mg/dL.  For medical purposes only. Performed at [facility], [address] [place], [place] [address]   Protime-INR     Status: None   Collection Time: 10/03/21 10:18 PM  Result Value Ref Range   Prothrombin Time 12.9 11.4 - 15.2 seconds   INR 1.0 0.8 - 1.2    Comment: (NOTE) INR goal varies based on device and disease states. Performed at [facility], [address] [place], [place] [address]   APTT     Status: None   Collection Time: 10/03/21 10:18 PM  Result Value Ref Range   aPTT 33 24 - 36 seconds    Comment: Performed at [facility], [address] [place], [place] [address]  CBC     Status: None   Collection Time: 10/03/21 10:18 PM  Result Value Ref Range   WBC 6.7 4.0 - 10.5 K/uL   RBC 4.22 3.87 - 5.11 MIL/uL   Hemoglobin 12.2 12.0 - 15.0 g/dL   HCT 02.5 85.2 - 77.8 %   MCV 86.7 80.0 - 100.0 fL   MCH 28.9 26.0 - 34.0 pg   MCHC 33.3 30.0 - 36.0 g/dL   RDW 24.2 35.3 - 61.4 %   Platelets 192 150 - 400 K/uL   nRBC 0.0 0.0 - 0.2 %    Comment: Performed at [facility], [address] [place], [place] [address]  Differential     Status: None   Collection Time: 10/03/21 10:18 PM  Result Value Ref Range   Neutrophils Relative % 39 %   Neutro Abs 2.6 1.7 - 7.7 K/uL   Lymphocytes Relative 50 %   Lymphs Abs 3.3 0.7 -  4.0 K/uL   Monocytes Relative 9 %   Monocytes Absolute 0.6 0.1 - 1.0 K/uL   Eosinophils Relative 2 %   Eosinophils Absolute 0.1 0.0 - 0.5 K/uL   Basophils Relative 0 %   Basophils Absolute 0.0 0.0 - 0.1 K/uL   Immature Granulocytes 0 %   Abs Immature Granulocytes 0.01 0.00 - 0.07 K/uL    Comment: Performed at [facility], [address] [place], [place] [address]  Comprehensive metabolic panel     Status: Abnormal   Collection Time: 10/03/21 10:18 PM  Result Value Ref Range   Sodium 140 135 - 145 mmol/L   Potassium 4.1 3.5 - 5.1 mmol/L   Chloride 107 98 - 111 mmol/L   CO2 26 22 - 32 mmol/L   Glucose, Bld 117 (H) 70 - 99 mg/dL    Comment: Glucose reference range applies only to samples taken after fasting for at least 8 hours.   BUN 21 8 - 23 mg/dL   Creatinine, Ser 6.19 0.44 - 1.00 mg/dL   Calcium 9.2 8.9 - 50.9 mg/dL   Total Protein 7.6 6.5 - 8.1 g/dL   Albumin 4.0 3.5 - 5.0 g/dL   AST 27 15 - 41 U/L   ALT 20 0 - 44 U/L   Alkaline Phosphatase 58 38 - 126 U/L   Total Bilirubin 0.5 0.3 - 1.2 mg/dL   GFR, Estimated >32 >67 mL/min    Comment: (NOTE) Calculated using the CKD-EPI Creatinine Equation (2021)    Anion gap 7 5 - 15    Comment: Performed at [facility], [address] [place],  [place] [address]  CBG monitoring, ED     Status: Abnormal   Collection Time: 10/03/21 10:37 PM  Result Value Ref Range   Glucose-Capillary 109 (H) 70 - 99 mg/dL    Comment: Glucose reference range applies only to samples taken after fasting for at least 8 hours.  Urine Drug Screen, Qualitative     Status: None   Collection Time: 10/04/21  1:59 AM  Result Value Ref Range   Tricyclic, Ur Screen NONE DETECTED NONE DETECTED   Amphetamines, Ur Screen NONE DETECTED NONE DETECTED   MDMA (Ecstasy)Ur Screen NONE DETECTED NONE DETECTED   Cocaine Metabolite,Ur Teller NONE DETECTED NONE DETECTED   Opiate, Ur Screen NONE DETECTED NONE DETECTED   Phencyclidine (PCP) Ur S NONE  DETECTED NONE DETECTED   Cannabinoid 50 Ng, Ur Menan NONE DETECTED NONE DETECTED   Barbiturates, Ur Screen NONE DETECTED NONE DETECTED   Benzodiazepine, Ur Scrn NONE DETECTED NONE DETECTED   Methadone Scn, Ur NONE DETECTED NONE DETECTED    Comment: (NOTE) Tricyclics + metabolites, urine    Cutoff 1000 ng/mL Amphetamines + metabolites, urine  Cutoff 1000 ng/mL MDMA (Ecstasy), urine              Cutoff 500 ng/mL Cocaine Metabolite, urine          Cutoff 300 ng/mL Opiate + metabolites, urine        Cutoff 300 ng/mL Phencyclidine (PCP), urine         Cutoff 25 ng/mL Cannabinoid, urine                 Cutoff 50 ng/mL Barbiturates + metabolites, urine  Cutoff 200 ng/mL Benzodiazepine, urine              Cutoff 200 ng/mL Methadone, urine                   Cutoff 300 ng/mL  The urine drug screen provides only a preliminary, unconfirmed analytical test result and should not be used for non-medical purposes. Clinical consideration and professional judgment should be applied to any positive drug screen result due to possible interfering substances. A more specific alternate chemical method must be used in order to obtain a confirmed analytical result. Gas chromatography / mass spectrometry (GC/MS) is the preferred confirm atory method. Performed at [facility], [address] [place], [place] [address]   Urinalysis, Routine w reflex microscopic     Status: Abnormal   Collection Time: 10/04/21  1:59 AM  Result Value Ref Range   Color, Urine STRAW (A) YELLOW   APPearance CLEAR (A) CLEAR   Specific Gravity, Urine 1.010 1.005 - 1.030   pH 6.0 5.0 - 8.0   Glucose, UA NEGATIVE NEGATIVE mg/dL   Hgb urine dipstick NEGATIVE NEGATIVE   Bilirubin Urine NEGATIVE NEGATIVE   Ketones, ur NEGATIVE NEGATIVE mg/dL   Protein, ur NEGATIVE NEGATIVE mg/dL   Nitrite NEGATIVE NEGATIVE   Leukocytes,Ua NEGATIVE NEGATIVE    Comment: Performed at [facility], [address] [place],  [place] [address]    Radiology: MR Cervical Spine W or Wo Contrast  Result Date: 10/04/2021 CLINICAL DATA:  Initial evaluation for acute left arm and facial numbness. EXAM: MRI CERVICAL SPINE WITHOUT AND WITH CONTRAST TECHNIQUE: Multiplanar and multiecho pulse sequences of the cervical spine, to include the craniocervical junction and cervicothoracic junction, were obtained without and with intravenous contrast. CONTRAST:  81mL GADAVIST GADOBUTROL 1 MMOL/ML IV SOLN COMPARISON:  None Available. FINDINGS: Alignment: Straightening of the  normal cervical lordosis. No significant listhesis. Vertebrae: Vertebral body height maintained without acute or chronic fracture. Bone marrow signal intensity mildly heterogeneous but overall within normal limits. No discrete or worrisome osseous lesions. No abnormal marrow edema or enhancement. Cord: Normal signal and morphology.  No abnormal enhancement. Posterior Fossa, vertebral arteries, paraspinal tissues: Craniocervical junction normal. Paraspinous soft tissues within normal limits. Normal flow voids seen within the vertebral arteries bilaterally. 8 mm right thyroid nodule noted, of doubtful significance given size and patient age, no follow-up imaging recommended (ref: J Am Coll Radiol. 2015 Feb;12(2): 143-50). Disc levels: C2-C3: Negative interspace. Minimal facet spurring. No canal or foraminal stenosis. C3-C4: Tiny central disc protrusion minimally indents the ventral thecal sac. Associated mild bilateral uncovertebral and facet hypertrophy. No significant spinal stenosis. Mild to moderate right with mild left C4 foraminal stenosis. C4-C5: Minimal disc bulge with left greater than right uncovertebral spurring. Mild l facet hypertrophy, also greater on the left. No significant spinal stenosis. Moderate left C5 foraminal stenosis. Right neural foramen remains patent. C5-C6: Minimal disc bulge with uncovertebral spurring. No significant spinal stenosis. Mild bilateral C6 foraminal  stenosis, slightly worse on the left. C6-C7: Minimal disc bulge with endplate and uncovertebral spurring. No canal or foraminal stenosis. C7-T1: Negative interspace. Mild facet and ligament flavum hypertrophy, greater on the left. No significant spinal stenosis. Foramina remain patent. Visualized upper thoracic spine demonstrates no significant finding. IMPRESSION: 1. No acute abnormality within the cervical spine or spinal cord. 2. Mild multilevel cervical spondylosis for age without significant spinal stenosis or overt neural impingement. 3. Multifactorial degenerative changes with resultant multilevel foraminal narrowing as above. Notable findings include mild to moderate right C4 foraminal stenosis, moderate left C5 foraminal narrowing, with mild bilateral C6 foraminal narrowing. Electronically Signed   By: Rise MuBenjamin  McClintock M.D.   On: 10/04/2021 02:11   MR Brain W and Wo Contrast  Result Date: 10/04/2021 CLINICAL DATA:  Initial evaluation for neuro deficit, stroke suspected. EXAM: MRI HEAD WITHOUT AND WITH CONTRAST TECHNIQUE: Multiplanar, multiecho pulse sequences of the brain and surrounding structures were obtained without and with intravenous contrast. CONTRAST:  8mL GADAVIST GADOBUTROL 1 MMOL/ML IV SOLN COMPARISON:  Prior CT from 10/03/2021 and MRI from 03/11/2019. FINDINGS: Brain: Cerebral volume within normal limits. Few scattered punctate foci of T2/FLAIR hyperintensity noted involving the supratentorial cerebral white matter, nonspecific, but overall minimal in nature, and felt to be within normal limits for age. Small remote right cerebellar infarct noted (series 10, image 6). No evidence for acute or subacute ischemia. Gray-white matter differentiation otherwise maintained. No other areas of chronic cortical infarction. No acute or chronic intracranial blood products. No mass lesion, midline shift or mass effect. No hydrocephalus or extra-axial fluid collection. Pituitary gland and suprasellar  region within normal limits. No abnormal enhancement. Vascular: Major intracranial vascular flow voids are maintained. Skull and upper cervical spine: Craniocervical junction within normal limits. Bone marrow signal intensity normal. No scalp soft tissue abnormality. Sinuses/Orbits: Globes and orbital soft tissues within normal limits. Chronic frontal sinusitis noted. Paranasal sinuses are otherwise largely clear. Trace right mastoid effusion noted, of doubtful significance. Other: None. IMPRESSION: 1. No acute intracranial abnormality. 2. Small remote right cerebellar infarct. Otherwise normal brain MRI for patient age. 3. Chronic frontal sinusitis. Electronically Signed   By: Rise MuBenjamin  McClintock M.D.   On: 10/04/2021 02:03   CT HEAD CODE STROKE WO CONTRAST  Result Date: 10/03/2021 CLINICAL DATA:  Code stroke. Initial evaluation for neuro deficit, stroke suspected. EXAM: CT HEAD WITHOUT  CONTRAST TECHNIQUE: Contiguous axial images were obtained from the base of the skull through the vertex without intravenous contrast. RADIATION DOSE REDUCTION: This exam was performed according to the departmental dose-optimization program which includes automated exposure control, adjustment of the mA and/or kV according to patient size and/or use of iterative reconstruction technique. COMPARISON:  Comparison made with prior MRI from 10/09/2019. FINDINGS: Brain: Cerebral volume within normal limits. No acute intracranial hemorrhage. No acute large vessel territory infarct. No mass lesion, midline shift or mass effect. No hydrocephalus or extra-axial fluid collection. Vascular: No abnormal hyperdense vessel. Skull: Scalp soft tissues and calvarium within normal limits. Sinuses/Orbits: Globes orbital soft tissues within normal limits. Chronic frontal sinusitis noted. No mastoid effusion. Other: None. ASPECTS [facility] Stroke Program Early CT Score) - Ganglionic level infarction (caudate, lentiform nuclei, internal capsule, insula,  M1-M3 cortex): 7 - Supraganglionic infarction (M4-M6 cortex): 3 Total score (0-10 with 10 being normal): 10 IMPRESSION: 1. No acute intracranial abnormality. 2. ASPECTS is 10. 3. Chronic frontal sinusitis. Results were called by telephone at the time of interpretation on 10/03/2021 at 10:42 pm to provider [facility] , who verbally acknowledged these results. Electronically Signed   By: Rise Mu M.D.   On: 10/03/2021 22:51    No results found.  No results found.    Assessment and Plan: Patient Active Problem List   Diagnosis Date Noted   Obesity (BMI 30-39.9) 01/24/2020   OSA on CPAP 12/20/2019   CPAP use counseling 12/20/2019   Hyposomnia, insomnia or sleeplessness associated with anxiety    Benign essential HTN 09/22/2019   Other chest pain 09/22/2019   Hyperlipidemia, mixed 08/13/2019   Sarcoidosis    Mediastinal adenopathy 12/21/2014    1. OSA on CPAP The patient does tolerate PAP and reports  benefit from PAP use. The patient was reminded how to clean equipment and advised to replace supplies routinely. The patient was also counselled on weight loss. The compliance is good. The AHI is 0.9.   OSA on cpap- CPAP continues to be medically necessary to treat this patient's OSA.  Continue with good compliance- work towards increasing night of sleep (pt gets up to use bathroom and does not reconnect pap after) F/u one year.    2. CPAP use counseling CPAP Counseling: had a lengthy discussion with the patient regarding the importance of PAP therapy in management of the sleep apnea. Patient appears to understand the risk factor reduction and also understands the risks associated with untreated sleep apnea. Patient will try to make a good faith effort to remain compliant with therapy. Also instructed the patient on proper cleaning of the device including the water must be changed daily if possible and use of distilled water is preferred. Patient understands that the machine  should be regularly cleaned with appropriate recommended cleaning solutions that do not damage the PAP machine for example given white vinegar and water rinses. Other methods such as ozone treatment may not be as good as these simple methods to achieve cleaning.    General Counseling: I have discussed the findings of the evaluation and examination with Kevin Fenton.  I have also discussed any further diagnostic evaluation thatmay be needed or ordered today. Carina verbalizes understanding of the findings of todays visit. We also reviewed her medications today and discussed drug interactions and side effects including but not limited excessive drowsiness and altered mental states. We also discussed that there is always a risk not just to her but also people around her. she has  been encouraged to call the office with any questions or concerns that should arise related to todays visit.  No orders of the defined types were placed in this encounter.       I have personally obtained a history, examined the patient, evaluated laboratory and imaging results, formulated the assessment and plan and placed orders. This patient was seen today by Tressie Ellis, PA-C in collaboration with Dr. Devona Konig.   Allyne Gee, MD [facility] Diplomate ABMS Pulmonary Critical Care Medicine and Sleep Medicine

## 2021-12-31 ENCOUNTER — Ambulatory Visit (INDEPENDENT_AMBULATORY_CARE_PROVIDER_SITE_OTHER): Payer: BC Managed Care – PPO | Admitting: Internal Medicine

## 2021-12-31 VITALS — BP 134/87 | HR 67 | Resp 14 | Ht 65.0 in | Wt 196.0 lb

## 2021-12-31 DIAGNOSIS — Z7189 Other specified counseling: Secondary | ICD-10-CM | POA: Diagnosis not present

## 2021-12-31 DIAGNOSIS — G4733 Obstructive sleep apnea (adult) (pediatric): Secondary | ICD-10-CM

## 2021-12-31 NOTE — Patient Instructions (Signed)

## 2022-01-17 DIAGNOSIS — G4733 Obstructive sleep apnea (adult) (pediatric): Secondary | ICD-10-CM | POA: Diagnosis not present

## 2022-01-25 ENCOUNTER — Telehealth: Payer: Self-pay | Admitting: Nurse Practitioner

## 2022-01-25 NOTE — Telephone Encounter (Signed)
Left vm to confirm 01/31/22 appointment-Toni 

## 2022-01-30 DIAGNOSIS — D3122 Benign neoplasm of left retina: Secondary | ICD-10-CM | POA: Diagnosis not present

## 2022-01-30 DIAGNOSIS — D3121 Benign neoplasm of right retina: Secondary | ICD-10-CM | POA: Diagnosis not present

## 2022-01-31 ENCOUNTER — Encounter: Payer: Self-pay | Admitting: Nurse Practitioner

## 2022-01-31 ENCOUNTER — Ambulatory Visit (INDEPENDENT_AMBULATORY_CARE_PROVIDER_SITE_OTHER): Payer: BC Managed Care – PPO | Admitting: Nurse Practitioner

## 2022-01-31 ENCOUNTER — Encounter: Payer: BC Managed Care – PPO | Admitting: Nurse Practitioner

## 2022-01-31 VITALS — BP 135/85 | HR 73 | Temp 96.0°F | Resp 16 | Ht 65.0 in | Wt 200.0 lb

## 2022-01-31 DIAGNOSIS — R590 Localized enlarged lymph nodes: Secondary | ICD-10-CM

## 2022-01-31 DIAGNOSIS — D869 Sarcoidosis, unspecified: Secondary | ICD-10-CM

## 2022-01-31 MED ORDER — VENTOLIN HFA 108 (90 BASE) MCG/ACT IN AERS: 2.0000 | INHALATION_SPRAY | RESPIRATORY_TRACT | 3 refills | Status: DC | PRN

## 2022-01-31 NOTE — Progress Notes (Signed)
[facility] [address] [place], [place] [address]  Internal MEDICINE  Office Visit Note  Patient Name: Valerie Yu  [id number]  [id number]  Date of Service: 01/31/2022  Chief Complaint  Patient presents with   Acute Visit    Pulmonary -- sarcoidosis chest pain     HPI Valerie Yu presents for an acute sick visit for chest wall pain under left breast  Onset -- was a while ago but the pain is consistent and want to check to make sure everything is ok.  Was having pain with breathing but not now Pain comes and goes but is manageable.     Current Medication:  Outpatient Encounter Medications as of 01/31/2022  Medication Sig   aspirin 81 MG EC tablet Take by mouth.   fluticasone (FLONASE) 50 MCG/ACT nasal spray Place into the nose.   naproxen (NAPROSYN) 500 MG tablet Take 1 tablet (500 mg total) by mouth 2 (two) times daily with a meal.   NON FORMULARY cpap device   simvastatin (ZOCOR) 40 MG tablet Take by mouth.   VENTOLIN HFA 108 (90 Base) MCG/ACT inhaler Inhale 2 puffs into the lungs every 4 (four) hours as needed for wheezing or shortness of breath.   zolpidem (AMBIEN) 10 MG tablet Take by mouth.   [DISCONTINUED] VENTOLIN HFA 108 (90 Base) MCG/ACT inhaler Inhale 2 puffs into the lungs every 4 (four) hours as needed for wheezing or shortness of breath.   No facility-administered encounter medications on file as of 01/31/2022.      Medical History: Past Medical History:  Diagnosis Date   Anxiety    GERD (gastroesophageal reflux disease)    History of panic attacks    Hyposomnia, insomnia or sleeplessness associated with anxiety    Sarcoidosis    Shortness of breath dyspnea    Sleep apnea      Vital Signs: BP 135/85   Pulse 73   Temp (!) 96 F (35.6 C)   Resp 16   Ht 5\' 5"  (1.651 m)   Wt 200 lb (90.7 kg)   SpO2 98%   BMI 33.28 kg/m    Review of Systems  Constitutional: Negative.  Negative for fatigue.  Respiratory:  Negative for cough,  chest tightness, shortness of breath and wheezing.        Pain with breathing  Cardiovascular:  Positive for chest pain (intermittent, worried about sarcoid lung). Negative for palpitations.  Neurological: Negative.     Physical Exam Vitals reviewed.  Constitutional:      General: She is not in acute distress.    Appearance: Normal appearance. She is obese. She is not ill-appearing.  HENT:     Head: Normocephalic and atraumatic.  Eyes:     Pupils: Pupils are equal, round, and reactive to light.  Cardiovascular:     Rate and Rhythm: Normal rate and regular rhythm.  Pulmonary:     Effort: Pulmonary effort is normal. No respiratory distress.  Neurological:     Mental Status: She is alert and oriented to person, place, and time.  Psychiatric:        Mood and Affect: Mood normal.        Behavior: Behavior normal.       Assessment/Plan: 1. Sarcoidosis CT chest ordered - CT Chest Wo Contrast; Future - VENTOLIN HFA 108 (90 Base) MCG/ACT inhaler; Inhale 2 puffs into the lungs every 4 (four) hours as needed for wheezing or shortness of breath.  Dispense: 18 g; Refill: 3  2. Mediastinal adenopathy CT chest ordered - CT Chest Wo Contrast; Future   General Counseling: Valerie Yu understanding of the findings of todays visit and agrees with plan of treatment. I have discussed any further diagnostic evaluation that may be needed or ordered today. We also reviewed her medications today. she has been encouraged to call the office with any questions or concerns that should arise related to todays visit.    Counseling:    Orders Placed This Encounter  Procedures   CT Chest Wo Contrast    Meds ordered this encounter  Medications   VENTOLIN HFA 108 (90 Base) MCG/ACT inhaler    Sig: Inhale 2 puffs into the lungs every 4 (four) hours as needed for wheezing or shortness of breath.    Dispense:  18 g    Refill:  3    Return if symptoms worsen or fail to improve, for will  call with CT chest results .  [place] Controlled Substance Database was reviewed by me for overdose risk score (ORS)  Time spent:20 Minutes Time spent with patient included reviewing progress notes, labs, imaging studies, and discussing plan for follow up.   This patient was seen by Sallyanne Kuster, FNP-C in collaboration with Dr. Beverely Risen as a part of collaborative care agreement.  [employer] R. Tedd Sias, MSN, FNP-C Internal Medicine

## 2022-01-31 NOTE — Progress Notes (Signed)
error 

## 2022-02-08 ENCOUNTER — Ambulatory Visit
Admission: RE | Admit: 2022-02-08 | Discharge: 2022-02-08 | Disposition: A | Discharge status: Home / Self Care | Payer: BC Managed Care – PPO | Source: Ambulatory Visit | Attending: Nurse Practitioner | Admitting: Nurse Practitioner

## 2022-02-08 DIAGNOSIS — D869 Sarcoidosis, unspecified: Secondary | ICD-10-CM | POA: Diagnosis not present

## 2022-02-08 DIAGNOSIS — R918 Other nonspecific abnormal finding of lung field: Secondary | ICD-10-CM | POA: Diagnosis not present

## 2022-02-08 DIAGNOSIS — R590 Localized enlarged lymph nodes: Secondary | ICD-10-CM | POA: Diagnosis not present

## 2022-02-11 ENCOUNTER — Telehealth: Payer: Self-pay

## 2022-02-11 NOTE — Telephone Encounter (Signed)
-----   Message from Sallyanne Kuster, NP sent at 02/11/2022  8:18 AM EST ----- Please call the patient and let her know that there are no significant changes on her CT chest. The enlarged lymph nodes and pulmonary nodules are stable and consistent with her sarcoidosis and no new nodules or abnormalities were found.

## 2022-02-11 NOTE — Progress Notes (Signed)
Please call the patient and let her know that there are no significant changes on her CT chest. The enlarged lymph nodes and pulmonary nodules are stable and consistent with her sarcoidosis and no new nodules or abnormalities were found.

## 2022-02-11 NOTE — Telephone Encounter (Signed)
Patient notified

## 2022-02-12 ENCOUNTER — Other Ambulatory Visit: Payer: Self-pay | Admitting: Gastroenterology

## 2022-02-12 DIAGNOSIS — R110 Nausea: Secondary | ICD-10-CM

## 2022-02-12 DIAGNOSIS — R1013 Epigastric pain: Secondary | ICD-10-CM | POA: Diagnosis not present

## 2022-02-12 DIAGNOSIS — R6881 Early satiety: Secondary | ICD-10-CM | POA: Diagnosis not present

## 2022-02-12 DIAGNOSIS — K219 Gastro-esophageal reflux disease without esophagitis: Secondary | ICD-10-CM | POA: Diagnosis not present

## 2022-02-22 ENCOUNTER — Ambulatory Visit
Admission: RE | Admit: 2022-02-22 | Discharge: 2022-02-22 | Disposition: A | Discharge status: Home / Self Care | Payer: BC Managed Care – PPO | Source: Ambulatory Visit | Attending: Gastroenterology | Admitting: Gastroenterology

## 2022-02-22 DIAGNOSIS — R110 Nausea: Secondary | ICD-10-CM | POA: Diagnosis not present

## 2022-02-22 DIAGNOSIS — R6881 Early satiety: Secondary | ICD-10-CM | POA: Diagnosis not present

## 2022-02-22 DIAGNOSIS — R1013 Epigastric pain: Secondary | ICD-10-CM | POA: Insufficient documentation

## 2022-02-22 DIAGNOSIS — K219 Gastro-esophageal reflux disease without esophagitis: Secondary | ICD-10-CM | POA: Diagnosis not present

## 2022-03-04 ENCOUNTER — Other Ambulatory Visit: Payer: Self-pay | Admitting: Family Medicine

## 2022-03-04 DIAGNOSIS — Z1231 Encounter for screening mammogram for malignant neoplasm of breast: Secondary | ICD-10-CM

## 2022-03-14 ENCOUNTER — Emergency Department: Payer: BC Managed Care – PPO

## 2022-03-14 ENCOUNTER — Other Ambulatory Visit: Payer: Self-pay

## 2022-03-14 ENCOUNTER — Emergency Department
Admission: EM | Admit: 2022-03-14 | Discharge: 2022-03-14 | Disposition: A | Discharge status: Home / Self Care | Payer: BC Managed Care – PPO | Attending: Emergency Medicine | Admitting: Emergency Medicine

## 2022-03-14 DIAGNOSIS — R079 Chest pain, unspecified: Secondary | ICD-10-CM | POA: Diagnosis not present

## 2022-03-14 DIAGNOSIS — R0789 Other chest pain: Secondary | ICD-10-CM | POA: Diagnosis not present

## 2022-03-14 DIAGNOSIS — K2900 Acute gastritis without bleeding: Secondary | ICD-10-CM

## 2022-03-14 LAB — CBC
HCT: 37.5 % (ref 36.0–46.0)
Hemoglobin: 12.6 g/dL (ref 12.0–15.0)
MCH: 29.3 pg (ref 26.0–34.0)
MCHC: 33.6 g/dL (ref 30.0–36.0)
MCV: 87.2 fL (ref 80.0–100.0)
Platelets: 214 10*3/uL (ref 150–400)
RBC: 4.3 MIL/uL (ref 3.87–5.11)
RDW: 12.8 % (ref 11.5–15.5)
WBC: 6.2 10*3/uL (ref 4.0–10.5)
nRBC: 0 % (ref 0.0–0.2)

## 2022-03-14 LAB — BASIC METABOLIC PANEL
Anion gap: 6 (ref 5–15)
BUN: 20 mg/dL (ref 8–23)
CO2: 25 mmol/L (ref 22–32)
Calcium: 8.9 mg/dL (ref 8.9–10.3)
Chloride: 106 mmol/L (ref 98–111)
Creatinine, Ser: 1.05 mg/dL — ABNORMAL HIGH (ref 0.44–1.00)
GFR, Estimated: 59 mL/min — ABNORMAL LOW (ref >60)
Glucose, Bld: 154 mg/dL — ABNORMAL HIGH (ref 70–99)
Potassium: 3.6 mmol/L (ref 3.5–5.1)
Sodium: 137 mmol/L (ref 135–145)

## 2022-03-14 LAB — TROPONIN I (HIGH SENSITIVITY): Troponin I (High Sensitivity): 3 ng/L (ref <18)

## 2022-03-14 MED ORDER — METOCLOPRAMIDE HCL 10 MG PO TABS: 10.0000 mg | ORAL_TABLET | Freq: Four times a day (QID) | ORAL | 0 refills | Status: DC | PRN

## 2022-03-14 NOTE — ED Notes (Signed)
Dr. Stafford at bedside.  

## 2022-03-14 NOTE — ED Provider Notes (Signed)
[facility] Provider Note    Event Date/Time   First MD Initiated Contact with Patient 03/14/22 1524     (approximate)   History   Chief Complaint: Chest Pain and Abdominal Pain   HPI  Valerie Yu is a 67 y.o. female with a past history of GERD, anxiety, sarcoidosis who comes ED complaining of upper abdominal pain radiating into the chest for the past month.  Chest discomfort is described as a fleeting sudden sharp discomfort which lasts less than a second at a time.  No shortness of breath.  Symptoms are worse with lying down and with eating.  Has early satiety and worsened pain after meals.  She avoids meals due to the symptoms.  No exertional symptoms.  No pleuritic symptoms.  No fever or dizziness or syncope or palpitations.  Has previously seen pulmonology and then was referred to gastroenterology who obtained labs, ultrasound, started her on PPI.  Symptoms were not adequately controlled so they recently added on Carafate which patient just picked up last night and has not yet started.  Reviewing outside records from gastroenterology clinic, I see they considered EGD but noted that she had had when endoscopy a year ago indicating gastritis, so deferred EGD on most recent evaluation.     Physical Exam   Triage Vital Signs: ED Triage Vitals  Enc Vitals Group     BP 03/14/22 1448 (!) 183/81     Pulse Rate 03/14/22 1448 80     Resp 03/14/22 1448 16     Temp 03/14/22 1448 98.6 F (37 C)     Temp Source 03/14/22 1448 Oral     SpO2 03/14/22 1448 96 %     Weight 03/14/22 1449 199 lb (90.3 kg)     Height 03/14/22 1449 5\' 4"  (1.626 m)     Head Circumference --      Peak Flow --      Pain Score 03/14/22 1449 4     Pain Loc --      Pain Edu? --      Excl. in [place]? --     Most recent vital signs: Vitals:   03/14/22 1448  BP: (!) 183/81  Pulse: 80  Resp: 16  Temp: 98.6 F (37 C)  SpO2: 96%    General: Awake, no distress.  CV:  Good  peripheral perfusion.  Regular rate and rhythm.  Normal distal pulses. Resp:  Normal effort. CTAB Abd:  No distention. Soft, NT. Other:  Mild lower chest wall tenderness over false ribs reproducing some of her pain symptoms.   ED Results / Procedures / Treatments   Labs (all labs ordered are listed, but only abnormal results are displayed) Labs Reviewed  BASIC METABOLIC PANEL - Abnormal; Notable for the following components:      Result Value   Glucose, Bld 154 (*)    Creatinine, Ser 1.05 (*)    GFR, Estimated 59 (*)    All other components within normal limits  CBC  TROPONIN I (HIGH SENSITIVITY)     EKG Interpreted by me NSR rate 74. Normal axis, intervals. PRWP. Normal ST segments and T waves   RADIOLOGY Chest xray interpreted by me, appears normal. Radiology report reviewed, noting stable hilar adenopathy   PROCEDURES:  Procedures   MEDICATIONS ORDERED IN ED: Medications - No data to display   IMPRESSION / MDM / [place] / ED COURSE  I reviewed the triage vital signs and the nursing  notes.                              Differential diagnosis includes, but is not limited to, GERD/gastritis, esophageal stricture, dehydration. Doubt ACS, PE, dissection, pericarditis  Patient's presentation is most consistent with acute presentation with potential threat to life or bodily function.  Pt p/w atypical CP.  Considering the patient's symptoms, medical history, and physical examination today, I have low suspicion for ACS, PE, TAD, pneumothorax, carditis, mediastinitis, pneumonia, CHF, or sepsis.  Sx c/w gastritis. Encouraged pt to continue ppi, start carafate, and I will also add reglan. Recommend continued f/u with GI.       FINAL CLINICAL IMPRESSION(S) / ED DIAGNOSES   Final diagnoses:  Atypical chest pain  Acute gastritis without hemorrhage, unspecified gastritis type     Rx / DC Orders   ED Discharge Orders          Ordered     metoCLOPramide (REGLAN) 10 MG tablet  Every 6 hours PRN        03/14/22 1605             Note:  This document was prepared using Dragon voice recognition software and may include unintentional dictation errors.   Carrie Mew, MD 03/14/22 [phone]

## 2022-03-14 NOTE — ED Triage Notes (Signed)
Pt c/o abd and CP x1 month with dizziness and h/a.

## 2022-03-14 NOTE — ED Notes (Signed)
Signature pad in room not working.  

## 2022-03-19 DIAGNOSIS — D485 Neoplasm of uncertain behavior of skin: Secondary | ICD-10-CM | POA: Diagnosis not present

## 2022-04-12 ENCOUNTER — Ambulatory Visit
Admission: RE | Admit: 2022-04-12 | Discharge: 2022-04-12 | Disposition: A | Discharge status: Home / Self Care | Payer: BC Managed Care – PPO | Source: Ambulatory Visit | Attending: Family Medicine | Admitting: Family Medicine

## 2022-04-12 DIAGNOSIS — Z1231 Encounter for screening mammogram for malignant neoplasm of breast: Secondary | ICD-10-CM | POA: Insufficient documentation

## 2022-07-17 DIAGNOSIS — R7309 Other abnormal glucose: Secondary | ICD-10-CM | POA: Diagnosis not present

## 2022-07-17 DIAGNOSIS — Z113 Encounter for screening for infections with a predominantly sexual mode of transmission: Secondary | ICD-10-CM | POA: Diagnosis not present

## 2022-07-17 DIAGNOSIS — R030 Elevated blood-pressure reading, without diagnosis of hypertension: Secondary | ICD-10-CM | POA: Diagnosis not present

## 2022-07-17 DIAGNOSIS — E785 Hyperlipidemia, unspecified: Secondary | ICD-10-CM | POA: Diagnosis not present

## 2022-07-17 DIAGNOSIS — Z1159 Encounter for screening for other viral diseases: Secondary | ICD-10-CM | POA: Diagnosis not present

## 2022-07-17 DIAGNOSIS — Z23 Encounter for immunization: Secondary | ICD-10-CM | POA: Diagnosis not present

## 2022-07-17 DIAGNOSIS — Z0000 Encounter for general adult medical examination without abnormal findings: Secondary | ICD-10-CM | POA: Diagnosis not present

## 2022-07-17 DIAGNOSIS — Z124 Encounter for screening for malignant neoplasm of cervix: Secondary | ICD-10-CM | POA: Diagnosis not present

## 2022-08-20 DIAGNOSIS — K219 Gastro-esophageal reflux disease without esophagitis: Secondary | ICD-10-CM | POA: Diagnosis not present

## 2022-08-20 DIAGNOSIS — R1013 Epigastric pain: Secondary | ICD-10-CM | POA: Diagnosis not present

## 2022-08-20 DIAGNOSIS — R0789 Other chest pain: Secondary | ICD-10-CM | POA: Diagnosis not present

## 2022-10-01 DIAGNOSIS — R059 Cough, unspecified: Secondary | ICD-10-CM | POA: Diagnosis not present

## 2022-10-01 DIAGNOSIS — J209 Acute bronchitis, unspecified: Secondary | ICD-10-CM | POA: Diagnosis not present

## 2022-11-04 ENCOUNTER — Ambulatory Visit: Payer: BC Managed Care – PPO

## 2022-11-04 DIAGNOSIS — R1013 Epigastric pain: Secondary | ICD-10-CM | POA: Diagnosis not present

## 2022-11-04 DIAGNOSIS — R0789 Other chest pain: Secondary | ICD-10-CM | POA: Diagnosis not present

## 2022-11-04 DIAGNOSIS — K449 Diaphragmatic hernia without obstruction or gangrene: Secondary | ICD-10-CM | POA: Diagnosis not present

## 2022-11-04 DIAGNOSIS — K219 Gastro-esophageal reflux disease without esophagitis: Secondary | ICD-10-CM | POA: Diagnosis not present

## 2022-12-27 NOTE — Progress Notes (Unsigned)
[facility] [address] [place], [place] [address]  Pulmonary Sleep Medicine   Office Visit Note  Patient Name: Valerie Yu DOB: 29-Apr-1955 MRN [id number]    Chief Complaint: Obstructive Sleep Apnea visit  Brief History:  Valerie Yu is seen today for an annual follow up visit for CPAP@ 10 cmH2O. The patient has a 11 year history of sleep apnea. Patient is using PAP nightly.  The patient feels rested after sleeping with PAP.  The patient reports benefiting from PAP use. Reported sleepiness is  improved and the Epworth Sleepiness Score is 0 out of 24. The patient does not take naps. The patient complains of the following: dislikes her mask. Mask fitting suggested when eligible.  The compliance download shows 81% compliance with an average use time of 5 hours 38 minutes. The AHI is 1.4.  The patient does not complain of limb movements disrupting sleep. The patient continues to require PAP therapy in order to eliminate sleep apnea.   ROS  General: (-) fever, (-) chills, (-) night sweat Nose and Sinuses: (-) nasal stuffiness or itchiness, (-) postnasal drip, (-) nosebleeds, (-) sinus trouble. Mouth and Throat: (-) sore throat, (-) hoarseness. Neck: (-) swollen glands, (-) enlarged thyroid, (-) neck pain. Respiratory: - cough, - shortness of breath, - wheezing. Neurologic: - numbness, - tingling. Psychiatric: - anxiety, - depression   Current Medication: Outpatient Encounter Medications as of 12/30/2022  Medication Sig   aspirin 81 MG EC tablet Take by mouth.   dexlansoprazole (DEXILANT) 60 MG capsule Take 1 capsule by mouth daily.   NON FORMULARY cpap device   simvastatin (ZOCOR) 40 MG tablet Take by mouth.   VENTOLIN HFA 108 (90 Base) MCG/ACT inhaler Inhale 2 puffs into the lungs every 4 (four) hours as needed for wheezing or shortness of breath.   zolpidem (AMBIEN) 10 MG tablet Take by mouth.   [DISCONTINUED] fluticasone (FLONASE) 50 MCG/ACT nasal spray Place into  the nose.   [DISCONTINUED] metoCLOPramide (REGLAN) 10 MG tablet Take 1 tablet (10 mg total) by mouth every 6 (six) hours as needed.   [DISCONTINUED] naproxen (NAPROSYN) 500 MG tablet Take 1 tablet (500 mg total) by mouth 2 (two) times daily with a meal.   No facility-administered encounter medications on file as of 12/30/2022.    Surgical History: Past Surgical History:  Procedure Laterality Date   ENDOBRONCHIAL ULTRASOUND N/A 12/21/2014   Procedure: ENDOBRONCHIAL ULTRASOUND;  Surgeon: Erin Fulling, MD;  Location: [facility] ORS;  Service: Cardiopulmonary;  Laterality: N/A;    Medical History: Past Medical History:  Diagnosis Date   Anxiety    GERD (gastroesophageal reflux disease)    History of panic attacks    Hyposomnia, insomnia or sleeplessness associated with anxiety    Sarcoidosis    Shortness of breath dyspnea    Sleep apnea     Family History: Non contributory to the present illness  Social History: Social History   Socioeconomic History   Marital status: Married    Spouse name: Not on file   Number of children: Not on file   Years of education: Not on file   Highest education level: Not on file  Occupational History   Not on file  Tobacco Use   Smoking status: Never   Smokeless tobacco: Never  Vaping Use   Vaping status: Never Used  Substance and Sexual Activity   Alcohol use: No   Drug use: No   Sexual activity: Not on file  Other Topics Concern  Not on file  Social History Narrative   Not on file   Social Determinants of Health   Financial Resource Strain: Not on file  Food Insecurity: Not on file  Transportation Needs: Not on file  Physical Activity: Not on file  Stress: Not on file  Social Connections: Not on file  Intimate Partner Violence: Not on file    Vital Signs: Blood pressure 139/80, pulse 76, resp. rate 12, height 5\' 5"  (1.651 m), weight 205 lb 8 oz (93.2 kg), SpO2 97%. Body mass index is 34.2 kg/m.    Examination: General  Appearance: The patient is well-developed, well-nourished, and in no distress. Neck Circumference: 37cm Skin: Gross inspection of skin unremarkable. Head: normocephalic, no gross deformities. Eyes: no gross deformities noted. ENT: ears appear grossly normal Neurologic: Alert and oriented. No involuntary movements.  STOP BANG RISK ASSESSMENT S (snore) Have you been told that you snore?     No   T (tired) Are you often tired, fatigued, or sleepy during the day?   NO  O (obstruction) Do you stop breathing, choke, or gasp during sleep? NO   P (pressure) Do you have or are you being treated for high blood pressure? NO   B (BMI) Is your body index greater than 35 kg/m? YES/NO   A (age) Are you 65 years old or older? YES   N (neck) Do you have a neck circumference greater than 16 inches?   NO   G (gender) Are you a female? NO   TOTAL STOP/BANG "YES" ANSWERS        A STOP-Bang score of 2 or less is considered low risk, and a score of 5 or more is high risk for having either moderate or severe OSA. For people who score 3 or 4, doctors may need to perform further assessment to determine how likely they are to have OSA.         EPWORTH SLEEPINESS SCALE:  Scale:  (0)= no chance of dozing; (1)= slight chance of dozing; (2)= moderate chance of dozing; (3)= high chance of dozing  Chance  Situtation    Sitting and reading: 0    Watching TV: 0    Sitting Inactive in public: 0    As a passenger in car: 0      Lying down to rest: 0    Sitting and talking: 0    Sitting quielty after lunch: 0    In a car, stopped in traffic: 0   TOTAL SCORE:   0 out of 24    SLEEP STUDIES:  Split Study (01/2012) AHI 107.6/hr, Central AHI 63.7/hr, min SpO2 82%, CPAP@8  cmH2O CPAP Titration (07/2014) CPAP@ 11 cmH2O   CPAP COMPLIANCE DATA:  Date Range: 12/27/2021-12/26/2022  Average Daily Use: 5 hours 38 minutes  Median Use: 6 hours 2 minutes  Compliance for > 4 Hours: 81%  AHI: 1.4  respiratory events per hour  Days Used: 360/365 days  Mask Leak: 20.8  95th Percentile Pressure: 10         LABS: No results found for this or any previous visit (from the past 2160 hour(s)).  Radiology: MM 3D SCREEN BREAST BILATERAL  Result Date: 04/16/2022 CLINICAL DATA:  Screening. EXAM: DIGITAL SCREENING BILATERAL MAMMOGRAM WITH TOMOSYNTHESIS AND CAD TECHNIQUE: Bilateral screening digital craniocaudal and mediolateral oblique mammograms were obtained. Bilateral screening digital breast tomosynthesis was performed. The images were evaluated with computer-aided detection. COMPARISON:  Previous exam(s). ACR Breast Density Category b: There are scattered areas of  fibroglandular density. FINDINGS: There are no findings suspicious for malignancy. IMPRESSION: No mammographic evidence of malignancy. A result letter of this screening mammogram will be mailed directly to the patient. RECOMMENDATION: Screening mammogram in one year. (Code:SM-B-01Y) BI-RADS CATEGORY  1: Negative. Electronically Signed   By: Edwin Cap M.D.   On: 04/16/2022 10:01    No results found.  No results found.    Assessment and Plan: Patient Active Problem List   Diagnosis Date Noted   Obesity (BMI 30-39.9) 01/24/2020   OSA on CPAP 12/20/2019   CPAP use counseling 12/20/2019   Hyposomnia, insomnia or sleeplessness associated with anxiety    Benign essential HTN 09/22/2019   Other chest pain 09/22/2019   Hyperlipidemia, mixed 08/13/2019   Sarcoidosis    Mediastinal adenopathy 12/21/2014    1. OSA on CPAP The patient does tolerate PAP and reports  benefit from PAP use. She dislikes her mask so a mask fitting will be scheduled. The patient was reminded how to clean equipment and advised to replace supplies routinely. The patient was also counselled on weight loss. The compliance is good. The AHI is 1.4.   OSA on cpap- controlled. Mask fitting for comfort. Continue with good  compliance with pap.  CPAP continues to be medically necessary to treat this patient's OSA. F/u one year.    2. CPAP use counseling CPAP Counseling: had a lengthy discussion with the patient regarding the importance of PAP therapy in management of the sleep apnea. Patient appears to understand the risk factor reduction and also understands the risks associated with untreated sleep apnea. Patient will try to make a good faith effort to remain compliant with therapy. Also instructed the patient on proper cleaning of the device including the water must be changed daily if possible and use of distilled water is preferred. Patient understands that the machine should be regularly cleaned with appropriate recommended cleaning solutions that do not damage the PAP machine for example given white vinegar and water rinses. Other methods such as ozone treatment may not be as good as these simple methods to achieve cleaning.   3. Insomnia, unspecified type Chronic, with excessive rumination. Sleep hygiene reviewed. Sleeps better away from home. Change home sleep environment. Active thinking strategies reviewed.    Methods to improve your sleep habits -Keep a consistent wind down period before your bedtime to help you relax. Do not do work-related activities around bedtime.  -Go to bed only when you are sleepy.  -Get out of bed at the same time every morning, even on holidays or weekends.  -If you haven't fallen asleep after lying in bed in 20 minutes, do something boring and relaxing with minimal light expo sure outside the bedroom. Return to bed when you are sleepy.  -While in bed, turn the clock away from you. It'll only remind you of something you probably don't want to know.  -No napping during the day, especially after 3 pm. If a nap is unavoidable, keep it under an hour.  -Your bedroom should be quiet and dark.  -Take a warm, relaxing bath or shower! A warm bath or shower prior to bedtime increasesyour body's temperature  so the subsequent cooling off period can help induce sleep.  -Exercise regularly, but not tough exercise within 6 hours or bedtime.  -Avoid eating large or fatty meals near bedtime.  -Avoid caffeine after lunch.    1 - Portions taken from http://yoursleep.HipsReplacement.fr.aspx    General Counseling: I have discussed the findings of the  evaluation and examination with Kevin Fenton.  I have also discussed any further diagnostic evaluation thatmay be needed or ordered today. Chelsie verbalizes understanding of the findings of todays visit. We also reviewed her medications today and discussed drug interactions and side effects including but not limited excessive drowsiness and altered mental states. We also discussed that there is always a risk not just to her but also people around her. she has been encouraged to call the office with any questions or concerns that should arise related to todays visit.  No orders of the defined types were placed in this encounter.       I have personally obtained a history, examined the patient, evaluated laboratory and imaging results, formulated the assessment and plan and placed orders. This patient was seen today by Emmaline Kluver, PA-C in collaboration with Dr. Freda Munro.   Yevonne Pax, MD [facility] Diplomate ABMS Pulmonary Critical Care Medicine and Sleep Medicine

## 2022-12-30 ENCOUNTER — Ambulatory Visit (INDEPENDENT_AMBULATORY_CARE_PROVIDER_SITE_OTHER): Payer: BC Managed Care – PPO | Admitting: Internal Medicine

## 2022-12-30 VITALS — BP 139/80 | HR 76 | Resp 12 | Ht 65.0 in | Wt 205.5 lb

## 2022-12-30 DIAGNOSIS — G4700 Insomnia, unspecified: Secondary | ICD-10-CM | POA: Insufficient documentation

## 2022-12-30 DIAGNOSIS — Z7189 Other specified counseling: Secondary | ICD-10-CM | POA: Diagnosis not present

## 2022-12-30 DIAGNOSIS — G4733 Obstructive sleep apnea (adult) (pediatric): Secondary | ICD-10-CM

## 2022-12-30 NOTE — Patient Instructions (Signed)

## 2023-01-07 DIAGNOSIS — G4733 Obstructive sleep apnea (adult) (pediatric): Secondary | ICD-10-CM | POA: Diagnosis not present

## 2023-02-10 ENCOUNTER — Encounter: Payer: Self-pay | Admitting: Family Medicine

## 2023-03-06 ENCOUNTER — Other Ambulatory Visit: Payer: Self-pay | Admitting: Family Medicine

## 2023-03-06 DIAGNOSIS — Z1231 Encounter for screening mammogram for malignant neoplasm of breast: Secondary | ICD-10-CM

## 2023-03-18 DIAGNOSIS — D23111 Other benign neoplasm of skin of right upper eyelid, including canthus: Secondary | ICD-10-CM | POA: Diagnosis not present

## 2023-04-15 DIAGNOSIS — R0789 Other chest pain: Secondary | ICD-10-CM | POA: Diagnosis not present

## 2023-04-15 DIAGNOSIS — F410 Panic disorder [episodic paroxysmal anxiety] without agoraphobia: Secondary | ICD-10-CM | POA: Diagnosis not present

## 2023-04-15 DIAGNOSIS — R1013 Epigastric pain: Secondary | ICD-10-CM | POA: Diagnosis not present

## 2023-04-15 DIAGNOSIS — R7309 Other abnormal glucose: Secondary | ICD-10-CM | POA: Diagnosis not present

## 2023-04-15 DIAGNOSIS — Z1389 Encounter for screening for other disorder: Secondary | ICD-10-CM | POA: Diagnosis not present

## 2023-04-15 DIAGNOSIS — R202 Paresthesia of skin: Secondary | ICD-10-CM | POA: Diagnosis not present

## 2023-04-15 DIAGNOSIS — E785 Hyperlipidemia, unspecified: Secondary | ICD-10-CM | POA: Diagnosis not present

## 2023-04-18 ENCOUNTER — Ambulatory Visit
Admission: RE | Admit: 2023-04-18 | Discharge: 2023-04-18 | Disposition: A | Discharge status: Home / Self Care | Payer: BC Managed Care – PPO | Source: Ambulatory Visit | Attending: Family Medicine | Admitting: Family Medicine

## 2023-04-18 DIAGNOSIS — Z1231 Encounter for screening mammogram for malignant neoplasm of breast: Secondary | ICD-10-CM | POA: Diagnosis not present

## 2023-04-28 ENCOUNTER — Ambulatory Visit (INDEPENDENT_AMBULATORY_CARE_PROVIDER_SITE_OTHER): Payer: BC Managed Care – PPO | Admitting: Nurse Practitioner

## 2023-04-28 ENCOUNTER — Encounter: Payer: Self-pay | Admitting: Nurse Practitioner

## 2023-04-28 VITALS — BP 136/84 | HR 72 | Temp 98.2°F | Resp 16 | Ht 65.0 in | Wt 202.6 lb

## 2023-04-28 DIAGNOSIS — D869 Sarcoidosis, unspecified: Secondary | ICD-10-CM | POA: Diagnosis not present

## 2023-04-28 DIAGNOSIS — R0602 Shortness of breath: Secondary | ICD-10-CM | POA: Diagnosis not present

## 2023-04-28 DIAGNOSIS — Z7189 Other specified counseling: Secondary | ICD-10-CM | POA: Diagnosis not present

## 2023-04-28 DIAGNOSIS — G4733 Obstructive sleep apnea (adult) (pediatric): Secondary | ICD-10-CM

## 2023-04-28 MED ORDER — VENTOLIN HFA 108 (90 BASE) MCG/ACT IN AERS: 2.0000 | INHALATION_SPRAY | RESPIRATORY_TRACT | 3 refills | Status: AC | PRN

## 2023-04-28 NOTE — Progress Notes (Signed)
 [facility] [address] [place], [place] [address]  Internal MEDICINE  Office Visit Note  Patient Name: Valerie Yu  [id number]  [id number]  Date of Service: 04/28/2023  Chief Complaint  Patient presents with   Follow-up    HPI Sammi presents for a follow-up visit for sarcoidosis, SOB, OSA on CPAP.  Sarcoidosis -- due for updated CT chest and PFT. Last CT chest was done in 2023 and her last PFT was done in 2020. Not needing any maintenance inhaler thus far but does have a prn albuterol inhaler.  OSA on CPAP -- CPAP@ 10 cmH2O. has a 12 year history of sleep apnea. Patient is using PAP nightly.  The patient feels rested after sleeping with CPAP.   Reports daytime sleepiness is  improved and her Epworth Sleepiness Score is 0 out of 24. The patient does not take naps during the day.  SOB -- overdue for PFT   Current Medication: Outpatient Encounter Medications as of 04/28/2023  Medication Sig   aspirin 81 MG EC tablet Take by mouth.   dexlansoprazole (DEXILANT) 60 MG capsule Take 1 capsule by mouth daily.   NON FORMULARY cpap device   simvastatin (ZOCOR) 40 MG tablet Take by mouth.   zolpidem (AMBIEN) 10 MG tablet Take by mouth.   [DISCONTINUED] VENTOLIN HFA 108 (90 Base) MCG/ACT inhaler Inhale 2 puffs into the lungs every 4 (four) hours as needed for wheezing or shortness of breath.   VENTOLIN HFA 108 (90 Base) MCG/ACT inhaler Inhale 2 puffs into the lungs every 4 (four) hours as needed for wheezing or shortness of breath.   No facility-administered encounter medications on file as of 04/28/2023.    Surgical History: Past Surgical History:  Procedure Laterality Date   ENDOBRONCHIAL ULTRASOUND N/A 12/21/2014   Procedure: ENDOBRONCHIAL ULTRASOUND;  Surgeon: Cleve Dale, MD;  Location: [facility] ORS;  Service: Cardiopulmonary;  Laterality: N/A;    Medical History: Past Medical History:  Diagnosis Date   Anxiety    GERD (gastroesophageal reflux disease)    History  of panic attacks    Hyposomnia, insomnia or sleeplessness associated with anxiety    Sarcoidosis    Shortness of breath dyspnea    Sleep apnea     Family History: Family History  Problem Relation Age of Onset   Diabetes Mother    CVA Mother    Hypertension Mother    Breast cancer Neg Hx     Social History   Socioeconomic History   Marital status: Married    Spouse name: Not on file   Number of children: Not on file   Years of education: Not on file   Highest education level: Not on file  Occupational History   Not on file  Tobacco Use   Smoking status: Never   Smokeless tobacco: Never  Vaping Use   Vaping status: Never Used  Substance and Sexual Activity   Alcohol use: No   Drug use: No   Sexual activity: Not on file  Other Topics Concern   Not on file  Social History Narrative   Not on file   Social Drivers of Health   Financial Resource Strain: Not on file  Food Insecurity: Not on file  Transportation Needs: Not on file  Physical Activity: Not on file  Stress: Not on file  Social Connections: Not on file  Intimate Partner Violence: Not on file      Review of Systems  Constitutional: Negative.  Negative for fatigue.  Respiratory:  Negative for cough, chest tightness, shortness of breath and wheezing.        Pain with breathing  Cardiovascular:  Positive for chest pain (intermittent chest wall pain, worried about sarcoid lung). Negative for palpitations.  Neurological: Negative.     Vital Signs: BP 136/84   Pulse 72   Temp 98.2 F (36.8 C)   Resp 16   Ht 5\' 5"  (1.651 m)   Wt 202 lb 9.6 oz (91.9 kg)   SpO2 95%   BMI 33.71 kg/m    Physical Exam Vitals reviewed.  Constitutional:      General: She is not in acute distress.    Appearance: Normal appearance. She is obese. She is not ill-appearing.  HENT:     Head: Normocephalic and atraumatic.  Eyes:     Pupils: Pupils are equal, round, and reactive to light.  Cardiovascular:     Rate and  Rhythm: Normal rate and regular rhythm.     Heart sounds: Normal heart sounds. No murmur heard. Pulmonary:     Effort: Pulmonary effort is normal. No respiratory distress.     Breath sounds: Normal breath sounds. No wheezing.  Neurological:     Mental Status: She is alert and oriented to person, place, and time.  Psychiatric:        Mood and Affect: Mood normal.        Behavior: Behavior normal.        Assessment/Plan: 1. Sarcoidosis (Primary) Continue prn albuterol as prescribed. CT chest and PFT ordered. Will follow up to discuss results.  - VENTOLIN HFA 108 (90 Base) MCG/ACT inhaler; Inhale 2 puffs into the lungs every 4 (four) hours as needed for wheezing or shortness of breath.  Dispense: 18 g; Refill: 3 - CT Chest Wo Contrast; Future - Pulmonary function test; Future  2. OSA on CPAP Continue CPAP use with a pressure of 10 cmH2O.   3. SOB (shortness of breath) Continue albuterol use as needed as prescribed. CT chest and PFT ordered, will follow up to discuss results.  - VENTOLIN HFA 108 (90 Base) MCG/ACT inhaler; Inhale 2 puffs into the lungs every 4 (four) hours as needed for wheezing or shortness of breath.  Dispense: 18 g; Refill: 3 - CT Chest Wo Contrast; Future - Pulmonary function test; Future  4. CPAP use counseling Continue CPAP use and maintenance as instructed. No questions or concerns and no supplies needed at this time.    General Counseling: rona tomson understanding of the findings of todays visit and agrees with plan of treatment. I have discussed any further diagnostic evaluation that may be needed or ordered today. We also reviewed her medications today. she has been encouraged to call the office with any questions or concerns that should arise related to todays visit.    Orders Placed This Encounter  Procedures   CT Chest Wo Contrast   Pulmonary function test    Meds ordered this encounter  Medications   VENTOLIN HFA 108 (90 Base) MCG/ACT  inhaler    Sig: Inhale 2 puffs into the lungs every 4 (four) hours as needed for wheezing or shortness of breath.    Dispense:  18 g    Refill:  3    Return in about 2 months (around 06/28/2023) for F/U, pulmonary/sleep, PFT @ [place], [place] to review PFT and CT chest .   Total time spent:30 Minutes Time spent includes review of chart, medications, test results, and follow up plan with the patient.  [place] Controlled Substance Database was reviewed by me.  This patient was seen by Laurence Pons, FNP-C in collaboration with Dr. Verneta Gone as a part of collaborative care agreement.   Matson Welch R. Bobbi Burow, MSN, FNP-C Internal medicine

## 2023-05-08 ENCOUNTER — Ambulatory Visit
Admission: RE | Admit: 2023-05-08 | Discharge: 2023-05-08 | Disposition: A | Discharge status: Home / Self Care | Source: Ambulatory Visit | Attending: Nurse Practitioner | Admitting: Nurse Practitioner

## 2023-05-08 DIAGNOSIS — D869 Sarcoidosis, unspecified: Secondary | ICD-10-CM | POA: Diagnosis not present

## 2023-05-08 DIAGNOSIS — J849 Interstitial pulmonary disease, unspecified: Secondary | ICD-10-CM | POA: Diagnosis not present

## 2023-05-08 DIAGNOSIS — R0602 Shortness of breath: Secondary | ICD-10-CM | POA: Diagnosis not present

## 2023-05-08 DIAGNOSIS — D860 Sarcoidosis of lung: Secondary | ICD-10-CM | POA: Diagnosis not present

## 2023-05-08 DIAGNOSIS — R918 Other nonspecific abnormal finding of lung field: Secondary | ICD-10-CM | POA: Diagnosis not present

## 2023-05-08 DIAGNOSIS — R590 Localized enlarged lymph nodes: Secondary | ICD-10-CM | POA: Diagnosis not present

## 2023-05-09 ENCOUNTER — Other Ambulatory Visit

## 2023-06-07 ENCOUNTER — Encounter: Payer: Self-pay | Admitting: Nurse Practitioner

## 2023-06-11 ENCOUNTER — Ambulatory Visit (INDEPENDENT_AMBULATORY_CARE_PROVIDER_SITE_OTHER): Admitting: Internal Medicine

## 2023-06-11 DIAGNOSIS — R0602 Shortness of breath: Secondary | ICD-10-CM

## 2023-06-11 DIAGNOSIS — D869 Sarcoidosis, unspecified: Secondary | ICD-10-CM

## 2023-06-16 NOTE — Procedures (Signed)
 [facility] [address] [place], [address]    Complete Pulmonary Function Testing Interpretation:  FINDINGS:   The forced vital capacity is normal.  FEV1 is normal.  FEV1 FVC ratio is normal.  Post bronchodilator there is no significant change in the FEV1.  Total lung capacity is normal.  Residual volume is normal.  Residual volume total lung capacity ratio is decreased.  FRC is normal.  The DLCO was within normal limits  IMPRESSION:   This pulmonary function study is within normal limits clinical correlation is recommended  Cordie Deters, MD [facility] Pulmonary Critical Care Medicine Sleep Medicine

## 2023-06-24 LAB — PULMONARY FUNCTION TEST

## 2023-06-26 ENCOUNTER — Ambulatory Visit (INDEPENDENT_AMBULATORY_CARE_PROVIDER_SITE_OTHER): Admitting: Physician Assistant

## 2023-06-26 ENCOUNTER — Encounter: Payer: Self-pay | Admitting: Physician Assistant

## 2023-06-26 VITALS — BP 128/82 | HR 88 | Temp 98.1°F | Resp 16 | Ht 65.0 in | Wt 201.8 lb

## 2023-06-26 DIAGNOSIS — G4733 Obstructive sleep apnea (adult) (pediatric): Secondary | ICD-10-CM

## 2023-06-26 DIAGNOSIS — D869 Sarcoidosis, unspecified: Secondary | ICD-10-CM

## 2023-06-26 NOTE — Progress Notes (Signed)
 [facility] [address] [place], [place] [address]  Pulmonary Sleep Medicine   Office Visit Note  Patient Name: Valerie Yu DOB: [birth year] MRN [id number]  Date of Service: 06/26/2023  Complaints/HPI: Pt is here for routine pulmonary follow up. Does still have some lower chest discomfort. States she does have hernia and follows with GI. She is going to follow up with GI as she sometimes feels a bulge in epigastric region/area of pain and is tender sometimes. PCP also aware and has given pain meds as needed. Pt advised to go to ED if acute worsening of pain or new symptoms arise. Breathing has been doing well. Using albuterol  some now due to pollen. PFT reviewed and is normal. CT chest showed stable pulmonary nodules and hilar lymphadenopathy that correlates with hx of sarcoidosis. No ILD. These findings are stable from previous CT findings. No acute findings. On CPAP for OSA.   ROS  General: (-) fever, (-) chills, (-) night sweats, (-) weakness Skin: (-) rashes, (-) itching,. Eyes: (-) visual changes, (-) redness, (-) itching. Nose and Sinuses: (-) nasal stuffiness or itchiness, (-) postnasal drip, (-) nosebleeds, (-) sinus trouble. Mouth and Throat: (-) sore throat, (-) hoarseness. Neck: (-) swollen glands, (-) enlarged thyroid, (-) neck pain. Respiratory: - cough, (-) bloody sputum, - shortness of breath, - wheezing. Cardiovascular: - ankle swelling, (-) chest pain. Lymphatic: (-) lymph node enlargement. Neurologic: (-) numbness, (-) tingling. Psychiatric: (-) anxiety, (-) depression   Current Medication: Outpatient Encounter Medications as of 06/26/2023  Medication Sig   aspirin  81 MG EC tablet Take by mouth.   dexlansoprazole (DEXILANT) 60 MG capsule Take 1 capsule by mouth daily.   NON FORMULARY cpap device   simvastatin (ZOCOR) 40 MG tablet Take by mouth.   VENTOLIN  HFA 108 (90 Base) MCG/ACT inhaler Inhale 2 puffs into the lungs every 4 (four) hours as  needed for wheezing or shortness of breath.   zolpidem (AMBIEN) 10 MG tablet Take by mouth.   No facility-administered encounter medications on file as of 06/26/2023.    Surgical History: Past Surgical History:  Procedure Laterality Date   ENDOBRONCHIAL ULTRASOUND N/A 12/21/2014   Procedure: ENDOBRONCHIAL ULTRASOUND;  Surgeon: Cleve Dale, MD;  Location: [facility] ORS;  Service: Cardiopulmonary;  Laterality: N/A;    Medical History: Past Medical History:  Diagnosis Date   Anxiety    GERD (gastroesophageal reflux disease)    History of panic attacks    Hyposomnia, insomnia or sleeplessness associated with anxiety    Sarcoidosis    Shortness of breath dyspnea    Sleep apnea     Family History: Family History  Problem Relation Age of Onset   Diabetes Mother    CVA Mother    Hypertension Mother    Breast cancer Neg Hx     Social History: Social History   Socioeconomic History   Marital status: Married    Spouse name: Not on file   Number of children: Not on file   Years of education: Not on file   Highest education level: Not on file  Occupational History   Not on file  Tobacco Use   Smoking status: Never   Smokeless tobacco: Never  Vaping Use   Vaping status: Never Used  Substance and Sexual Activity   Alcohol use: No   Drug use: No   Sexual activity: Not on file  Other Topics Concern   Not on file  Social History Narrative   Not on file  Social Drivers of Corporate investment banker Strain: Not on file  Food Insecurity: Not on file  Transportation Needs: Not on file  Physical Activity: Not on file  Stress: Not on file  Social Connections: Not on file  Intimate Partner Violence: Not on file    Vital Signs: Blood pressure 128/82, pulse 88, temperature 98.1 F (36.7 C), resp. rate 16, height 5\' 5"  (1.651 m), weight 201 lb 12.8 oz (91.5 kg), SpO2 96%.  Examination: General Appearance: The patient is well-developed, well-nourished, and in no  distress. Skin: Gross inspection of skin unremarkable. Head: normocephalic, no gross deformities. Eyes: no gross deformities noted. ENT: ears appear grossly normal no exudates. Neck: Supple. No thyromegaly. No LAD. Respiratory: Lungs clear to auscultation. Cardiovascular: Normal S1 and S2 without murmur or rub. Extremities: No cyanosis. pulses are equal. Neurologic: Alert and oriented. No involuntary movements.  LABS: Recent Results (from the past 2160 hours)  Pulmonary Function Test     Status: None   Collection Time: 06/24/23  9:38 AM  Result Value Ref Range   FEV1     FVC     FEV1/FVC     TLC     DLCO      Radiology: CT Chest Wo Contrast Result Date: 05/19/2023 CLINICAL DATA:  Diffuse interstitial lung disease, sarcoidosis EXAM: CT CHEST WITHOUT CONTRAST TECHNIQUE: Multidetector CT imaging of the chest was performed following the standard protocol without IV contrast. RADIATION DOSE REDUCTION: This exam was performed according to the departmental dose-optimization program which includes automated exposure control, adjustment of the mA and/or kV according to patient size and/or use of iterative reconstruction technique. COMPARISON:  Chest x-ray March 14, 2022, CT chest February 08, 2022 FINDINGS: Cardiovascular: No significant vascular findings. Normal heart size. No pericardial effusion. Mediastinum/Nodes: Stable mediastinal and hilar lymphadenopathy the largest of the lymph nodes in the prevascular aortic space and aortic pulmonic window measuring 1.9 x 0.9 and 1.8 by 0.9 cm. Lungs/Pleura: Stable 8 mm right upper lobe nodule image number 30. Stable 10 mm right middle lobe nodule Stable 9 x 6 mm fissural nodule anterior segment of the right lower lobe image 61 Stable 5 mm nodule of the left lower lobe image 60 Stable 5 mm left lower lobe nodule image 64. Upper Abdomen: No acute abnormality. Musculoskeletal: No chest wall mass or suspicious bone lesions identified. Persistent diffuse  mild kyphosis thoracic spine IMPRESSION: Stable pulmonary nodules and mediastinal and hilar lymphadenopathy that correlate with the patient's history of sarcoidosis. No evidence of interstitial lung disease No acute cardiopulmonary infiltrates or other significant findings Electronically Signed   By: Fredrich Jefferson M.D.   On: 05/19/2023 11:31    No results found.  No results found.    Assessment and Plan: Patient Active Problem List   Diagnosis Date Noted   Insomnia 12/30/2022   Obesity (BMI 30-39.9) 01/24/2020   OSA on CPAP 12/20/2019   CPAP use counseling 12/20/2019   Hyposomnia, insomnia or sleeplessness associated with anxiety    Benign essential HTN 09/22/2019   Other chest pain 09/22/2019   Hyperlipidemia, mixed 08/13/2019   Sarcoidosis    Mediastinal adenopathy 12/21/2014    1. Sarcoidosis (Primary) CT reviewed, stable from previous  2. OSA on CPAP Continue cpap nightly     General Counseling: I have discussed the findings of the evaluation and examination with Valerie Yu.  I have also discussed any further diagnostic evaluation thatmay be needed or ordered today. Valerie Yu verbalizes understanding of the findings of todays visit.  We also reviewed her medications today and discussed drug interactions and side effects including but not limited excessive drowsiness and altered mental states. We also discussed that there is always a risk not just to her but also people around her. she has been encouraged to call the office with any questions or concerns that should arise related to todays visit.  No orders of the defined types were placed in this encounter.    Time spent: 30  I have personally obtained a history, examined the patient, evaluated laboratory and imaging results, formulated the assessment and plan and placed orders. This patient was seen by Valerie Favia, PA-C in collaboration with Dr. Cam Cava as a part of collaborative care agreement.     Cordie Deters,  MD [facility] Pulmonary and Critical Care Sleep medicine

## 2023-09-05 DIAGNOSIS — D2310 Other benign neoplasm of skin of unspecified eyelid, including canthus: Secondary | ICD-10-CM | POA: Diagnosis not present

## 2023-09-05 DIAGNOSIS — D485 Neoplasm of uncertain behavior of skin: Secondary | ICD-10-CM | POA: Diagnosis not present

## 2023-09-05 DIAGNOSIS — L2989 Other pruritus: Secondary | ICD-10-CM | POA: Diagnosis not present

## 2023-09-05 DIAGNOSIS — L538 Other specified erythematous conditions: Secondary | ICD-10-CM | POA: Diagnosis not present

## 2023-11-18 ENCOUNTER — Telehealth: Payer: Self-pay | Admitting: Internal Medicine

## 2023-11-18 NOTE — Telephone Encounter (Signed)
 Last 2 office notes faxed to [facility]; [phone]

## 2023-11-20 DIAGNOSIS — G4733 Obstructive sleep apnea (adult) (pediatric): Secondary | ICD-10-CM | POA: Diagnosis not present

## 2023-12-17 DIAGNOSIS — Z1331 Encounter for screening for depression: Secondary | ICD-10-CM | POA: Diagnosis not present

## 2023-12-17 DIAGNOSIS — Z1389 Encounter for screening for other disorder: Secondary | ICD-10-CM | POA: Diagnosis not present

## 2023-12-17 DIAGNOSIS — Z0000 Encounter for general adult medical examination without abnormal findings: Secondary | ICD-10-CM | POA: Diagnosis not present

## 2023-12-17 DIAGNOSIS — R7309 Other abnormal glucose: Secondary | ICD-10-CM | POA: Diagnosis not present

## 2023-12-17 DIAGNOSIS — Z23 Encounter for immunization: Secondary | ICD-10-CM | POA: Diagnosis not present

## 2023-12-19 ENCOUNTER — Other Ambulatory Visit: Payer: Self-pay | Admitting: Family Medicine

## 2023-12-19 ENCOUNTER — Ambulatory Visit
Admission: RE | Admit: 2023-12-19 | Discharge: 2023-12-19 | Disposition: A | Discharge status: Home / Self Care | Source: Ambulatory Visit | Attending: Family Medicine | Admitting: Family Medicine

## 2023-12-19 ENCOUNTER — Ambulatory Visit
Admission: RE | Admit: 2023-12-19 | Discharge: 2023-12-19 | Disposition: A | Discharge status: Home / Self Care | Attending: Family Medicine | Admitting: Family Medicine

## 2023-12-19 DIAGNOSIS — M25462 Effusion, left knee: Secondary | ICD-10-CM | POA: Diagnosis not present

## 2023-12-19 DIAGNOSIS — M25562 Pain in left knee: Secondary | ICD-10-CM | POA: Diagnosis not present

## 2023-12-19 DIAGNOSIS — M1711 Unilateral primary osteoarthritis, right knee: Secondary | ICD-10-CM | POA: Diagnosis not present

## 2023-12-19 DIAGNOSIS — M1712 Unilateral primary osteoarthritis, left knee: Secondary | ICD-10-CM | POA: Diagnosis not present

## 2023-12-19 DIAGNOSIS — M25561 Pain in right knee: Secondary | ICD-10-CM

## 2024-01-05 ENCOUNTER — Ambulatory Visit

## 2024-01-16 NOTE — Progress Notes (Unsigned)
 [facility] [address] [place], [place] [address]  Pulmonary Sleep Medicine   Office Visit Note  Patient Name: Valerie Yu DOB: 1955-06-17 MRN [id number]    Chief Complaint: Obstructive Sleep Apnea visit  Brief History:  Valerie Yu is seen today for an annual follow up visit for CPAP@ 10 cmH2O. The patient has a 12 year history of sleep apnea. Patient is using PAP nightly.  The patient feels rested after sleeping with PAP.  The patient reports benefiting from PAP use. Reported sleepiness is  improved and the Epworth Sleepiness Score is 0 out of 24. The patient does not take naps. The patient complains of the following: pt is in need of a replacement power cord as it is somewhat loose in connection with her PAP.  The compliance download shows 91% compliance with an average use time of 6 hours 11 minutes. The AHI is 1.2.  The patient does not complain of limb movements disrupting sleep. The patient continues to require PAP therapy in order to eliminate sleep apnea.   ROS  General: (-) fever, (-) chills, (-) night sweat Nose and Sinuses: (-) nasal stuffiness or itchiness, (-) postnasal drip, (-) nosebleeds, (-) sinus trouble. Mouth and Throat: (-) sore throat, (-) hoarseness. Neck: (-) swollen glands, (-) enlarged thyroid, (-) neck pain. Respiratory: - cough, - shortness of breath, - wheezing. Neurologic: - numbness, - tingling. Psychiatric: - anxiety, - depression   Current Medication: Outpatient Encounter Medications as of 01/19/2024  Medication Sig   aspirin  81 MG EC tablet Take by mouth.   dexlansoprazole (DEXILANT) 60 MG capsule Take 1 capsule by mouth daily.   NON FORMULARY cpap device   simvastatin (ZOCOR) 40 MG tablet Take by mouth.   VENTOLIN  HFA 108 (90 Base) MCG/ACT inhaler Inhale 2 puffs into the lungs every 4 (four) hours as needed for wheezing or shortness of breath.   zolpidem (AMBIEN) 10 MG tablet Take by mouth.   No facility-administered  encounter medications on file as of 01/19/2024.    Surgical History: Past Surgical History:  Procedure Laterality Date   ENDOBRONCHIAL ULTRASOUND N/A 12/21/2014   Procedure: ENDOBRONCHIAL ULTRASOUND;  Surgeon: Nickolas Cellar, MD;  Location: [facility] ORS;  Service: Cardiopulmonary;  Laterality: N/A;    Medical History: Past Medical History:  Diagnosis Date   Anxiety    GERD (gastroesophageal reflux disease)    History of panic attacks    Hyposomnia, insomnia or sleeplessness associated with anxiety    Sarcoidosis    Shortness of breath dyspnea    Sleep apnea     Family History: Non contributory to the present illness  Social History: Social History   Socioeconomic History   Marital status: Married    Spouse name: Not on file   Number of children: Not on file   Years of education: Not on file   Highest education level: Not on file  Occupational History   Not on file  Tobacco Use   Smoking status: Never   Smokeless tobacco: Never  Vaping Use   Vaping status: Never Used  Substance and Sexual Activity   Alcohol use: No   Drug use: No   Sexual activity: Not on file  Other Topics Concern   Not on file  Social History Narrative   Not on file   Social Drivers of Health   Financial Resource Strain: Not on file  Food Insecurity: Not on file  Transportation Needs: Not on file  Physical Activity: Not on file  Stress: Not  on file  Social Connections: Not on file  Intimate Partner Violence: Not on file    Vital Signs: Blood pressure 133/79, pulse 81, resp. rate 16, height 5' 4 (1.626 m), weight 204 lb (92.5 kg), SpO2 98%. Body mass index is 35.02 kg/m.    Examination: General Appearance: The patient is well-developed, well-nourished, and in no distress. Neck Circumference: 37 cm Skin: Gross inspection of skin unremarkable. Head: normocephalic, no gross deformities. Eyes: no gross deformities noted. ENT: ears appear grossly normal Neurologic: Alert and oriented. No  involuntary movements.  STOP BANG RISK ASSESSMENT S (snore) Have you been told that you snore?     NO   T (tired) Are you often tired, fatigued, or sleepy during the day?   NO  O (obstruction) Do you stop breathing, choke, or gasp during sleep? NO   P (pressure) Do you have or are you being treated for high blood pressure? NO   B (BMI) Is your body index greater than 35 kg/m? NO   A (age) Are you 66 years old or older? YES   N (neck) Do you have a neck circumference greater than 16 inches?   NO   G (gender) Are you a female? NO   TOTAL STOP/BANG "YES" ANSWERS 1       A STOP-Bang score of 2 or less is considered low risk, and a score of 5 or more is high risk for having either moderate or severe OSA. For people who score 3 or 4, doctors may need to perform further assessment to determine how likely they are to have OSA.         EPWORTH SLEEPINESS SCALE:  Scale:  (0)= no chance of dozing; (1)= slight chance of dozing; (2)= moderate chance of dozing; (3)= high chance of dozing  Chance  Situtation    Sitting and reading: 0    Watching TV: 0    Sitting Inactive in public: 0    As a passenger in car: 0      Lying down to rest: 0    Sitting and talking: 0    Sitting quielty after lunch: 0    In a car, stopped in traffic: 0   TOTAL SCORE:   0 out of 24    SLEEP STUDIES:  Split Study (01/2012) AHI 107.6/hr, central AHI 63.7/hr, min SpO2 82%, CPAP@ 8 cmH2O Titration (07/2014) CPAP@ 11 cmH2O   CPAP COMPLIANCE DATA:  Date Range: 01/19/2023-01/18/2024  Average Daily Use: 6 hours 11 minutes  Median Use: 6 hours 19 minutes  Compliance for > 4 Hours: 91%  AHI: 1.2 respiratory events per hour  Days Used: 357/365 days  Mask Leak: 20.7  95th Percentile Pressure: 10         LABS: No results found for this or any previous visit (from the past 2160 hours).  Radiology: DG Knee Complete 4 Views Left Result Date: 12/21/2023 EXAM: 4 OR MORE VIEW(S) XRAY  OF THE LEFT KNEE 12/19/2023 12:04:31 PM COMPARISON: 10/16/2019 CLINICAL HISTORY: FINDINGS: BONES AND JOINTS: No acute fracture. No focal osseous lesion. No joint dislocation. Progressive mild joint space narrowing and osteophyte formation of the medial and patellofemoral compartments. Trace suprapatellar knee joint effusion. SOFT TISSUES: The soft tissues are unremarkable. IMPRESSION: 1. 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 2. Trace suprapatellar knee joint effusion. Electronically signed by: Katheleen Faes MD 12/21/2023 03:39 PM EDT RP Workstation: [id number]   DG Knee Complete 4 Views Right Result Date: 12/21/2023 EXAM: 4  VIEW(S) XRAY OF THE RIGHT KNEE 12/19/2023 12:04:31 PM COMPARISON: Comparison study dated 10/16/2019. CLINICAL HISTORY: Bilateral knee pain. Bilateral knee pain, Right worse than left. No known injury. Weight bearing/standing study. FINDINGS: BONES AND JOINTS: No acute fracture. No focal osseous lesion. No joint dislocation. No significant joint effusion. Progressive Mild medial compartmental osteoarthritis. Moderate osteoarthritis of the patellofemoral compartment. SOFT TISSUES: The soft tissues are unremarkable. IMPRESSION: 1. Progressive mild medial compartment osteoarthritis. 2. Moderate patellofemoral compartment osteoarthritis. Electronically signed by: Katheleen Faes MD 12/21/2023 03:39 PM EDT RP Workstation: [id number]    No results found.  No results found.     Assessment and Plan: Patient Active Problem List   Diagnosis Date Noted   Insomnia 12/30/2022   Obesity (BMI 30-39.9) 01/24/2020   OSA on CPAP 12/20/2019   CPAP use counseling 12/20/2019   Hyposomnia, insomnia or sleeplessness associated with anxiety    Benign essential HTN 09/22/2019   Other chest pain 09/22/2019   Hyperlipidemia, mixed 08/13/2019   Sarcoidosis    Mediastinal adenopathy 12/21/2014   1. OSA on CPAP (Primary) The patient does tolerate PAP and  reports  benefit from PAP use. The patient was reminded how to clean equipment and advised to replace supplies routinely. The patient was also counselled on weight loss. The compliance is excellent. The AHI is 1.2.   OSA on cpap- controlled. Continue with excellent compliance with pap. CPAP continues to be medically necessary to treat this patient's OSA. F/u one year.     2. CPAP use counseling CPAP Counseling: had a lengthy discussion with the patient regarding the importance of PAP therapy in management of the sleep apnea. Patient appears to understand the risk factor reduction and also understands the risks associated with untreated sleep apnea. Patient will try to make a good faith effort to remain compliant with therapy. Also instructed the patient on proper cleaning of the device including the water must be changed daily if possible and use of distilled water is preferred. Patient understands that the machine should be regularly cleaned with appropriate recommended cleaning solutions that do not damage the PAP machine for example given white vinegar and water rinses. Other methods such as ozone treatment may not be as good as these simple methods to achieve cleaning.   3. Sarcoidosis Stable and asymptomatic. Continue follow up with pulmonology as scheduled.      General Counseling: I have discussed the findings of the evaluation and examination with Valerie Yu.  I have also discussed any further diagnostic evaluation thatmay be needed or ordered today. Valerie Yu verbalizes understanding of the findings of todays visit. We also reviewed her medications today and discussed drug interactions and side effects including but not limited excessive drowsiness and altered mental states. We also discussed that there is always a risk not just to her but also people around her. she has been encouraged to call the office with any questions or concerns that should arise related to todays visit.  No orders of the  defined types were placed in this encounter.       I have personally obtained a history, examined the patient, evaluated laboratory and imaging results, formulated the assessment and plan and placed orders. This patient was seen today by Lauraine Lay, PA-C in collaboration with Dr. Elfreda Bathe.   Elfreda DELENA Bathe, MD [facility] Diplomate ABMS Pulmonary Critical Care Medicine and Sleep Medicine

## 2024-01-19 ENCOUNTER — Ambulatory Visit (INDEPENDENT_AMBULATORY_CARE_PROVIDER_SITE_OTHER): Admitting: Internal Medicine

## 2024-01-19 VITALS — BP 133/79 | HR 81 | Resp 16 | Ht 64.0 in | Wt 204.0 lb

## 2024-01-19 DIAGNOSIS — D869 Sarcoidosis, unspecified: Secondary | ICD-10-CM

## 2024-01-19 DIAGNOSIS — G4733 Obstructive sleep apnea (adult) (pediatric): Secondary | ICD-10-CM

## 2024-01-19 DIAGNOSIS — Z7189 Other specified counseling: Secondary | ICD-10-CM | POA: Diagnosis not present

## 2024-01-19 NOTE — Patient Instructions (Signed)

## 2024-01-20 IMAGING — MG MM DIGITAL SCREENING BILAT W/ TOMO AND CAD
6 of 10 series · 6 of 30 positions shown · non-contrast
Comparison: Previous exam(s).

CLINICAL DATA: Screening.

EXAM:
DIGITAL SCREENING BILATERAL MAMMOGRAM WITH TOMOSYNTHESIS AND CAD
TECHNIQUE: Bilateral screening digital craniocaudal and mediolateral oblique
mammograms were obtained. Bilateral screening digital breast
tomosynthesis was performed. The images were evaluated with
computer-aided detection.

[R CC synth-2D (1 of 2)]
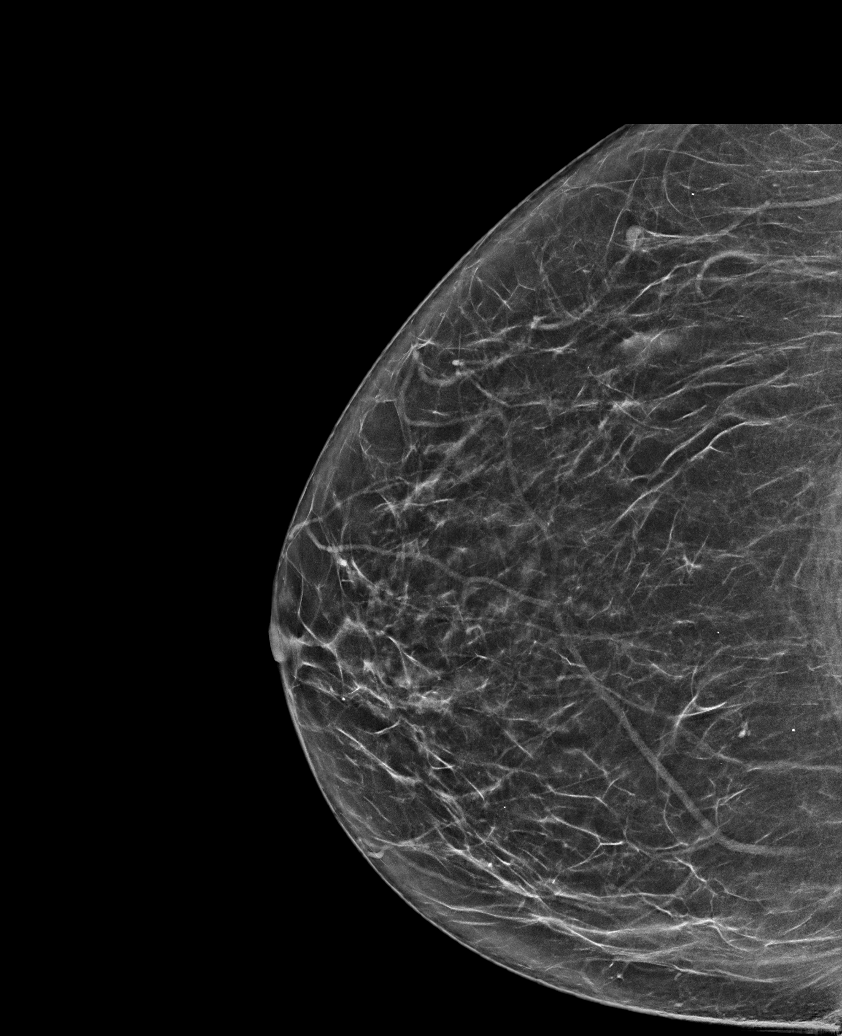

[L MLO synth-2D]
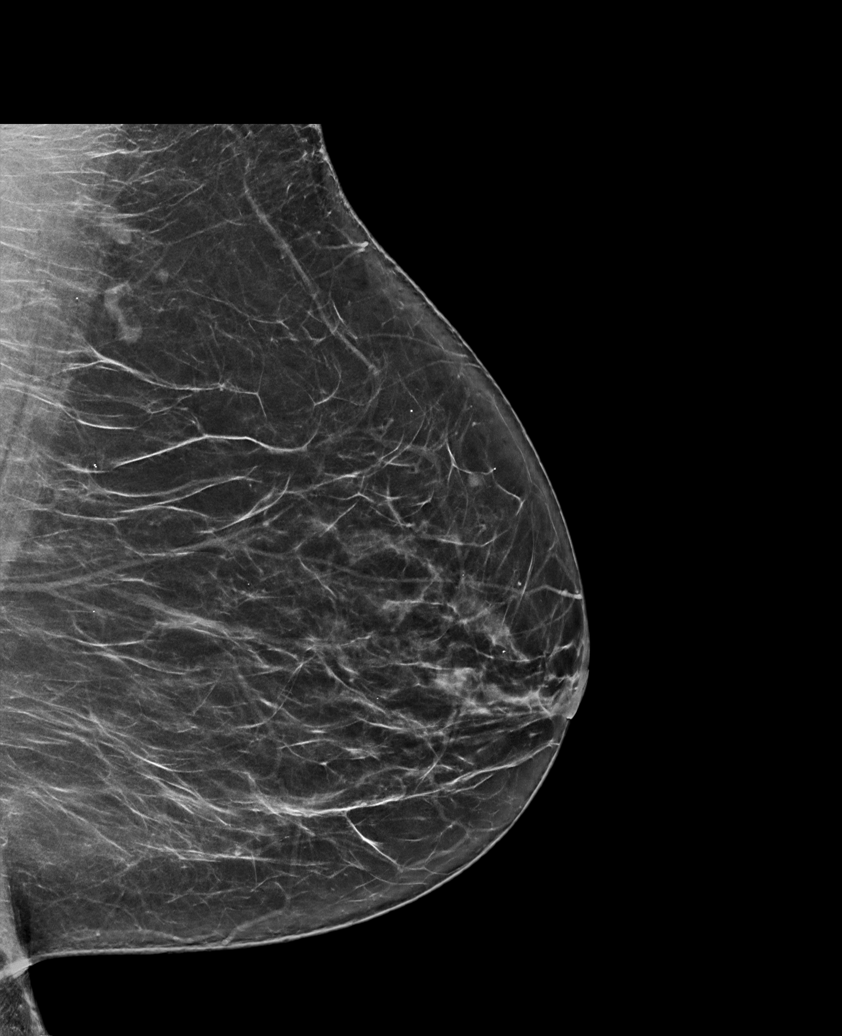

[R CC synth-2D (2 of 2)]
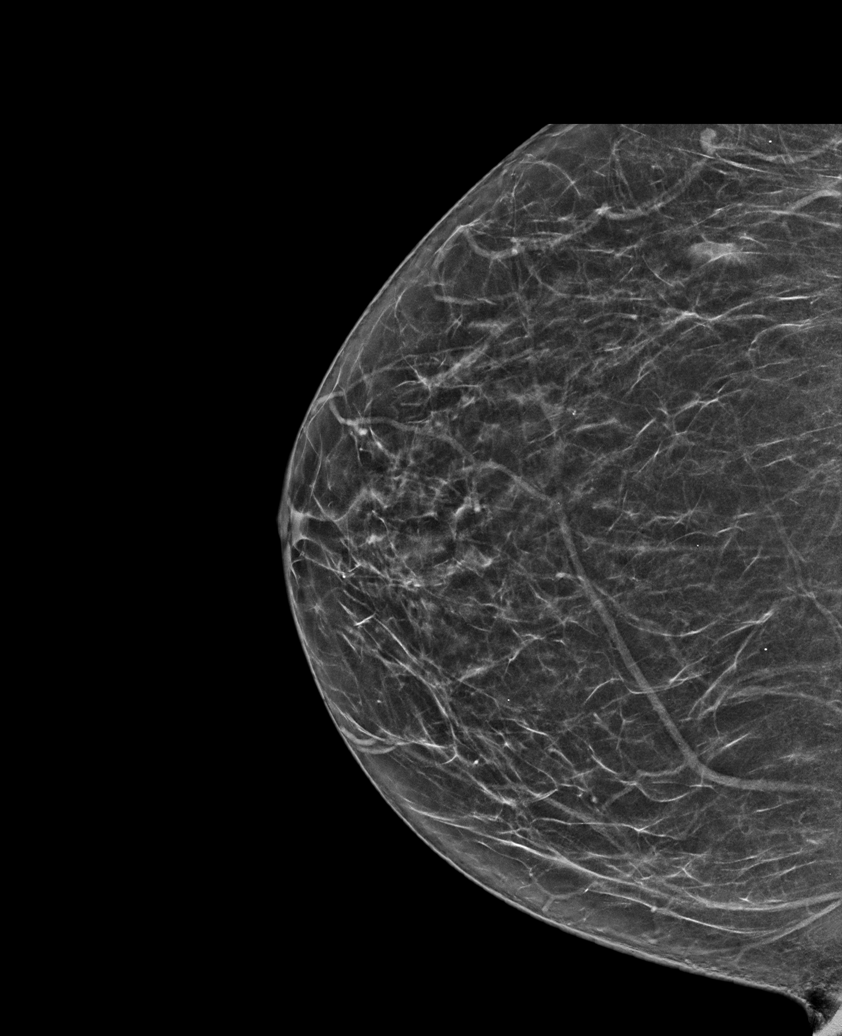

[R MLO synth-2D]
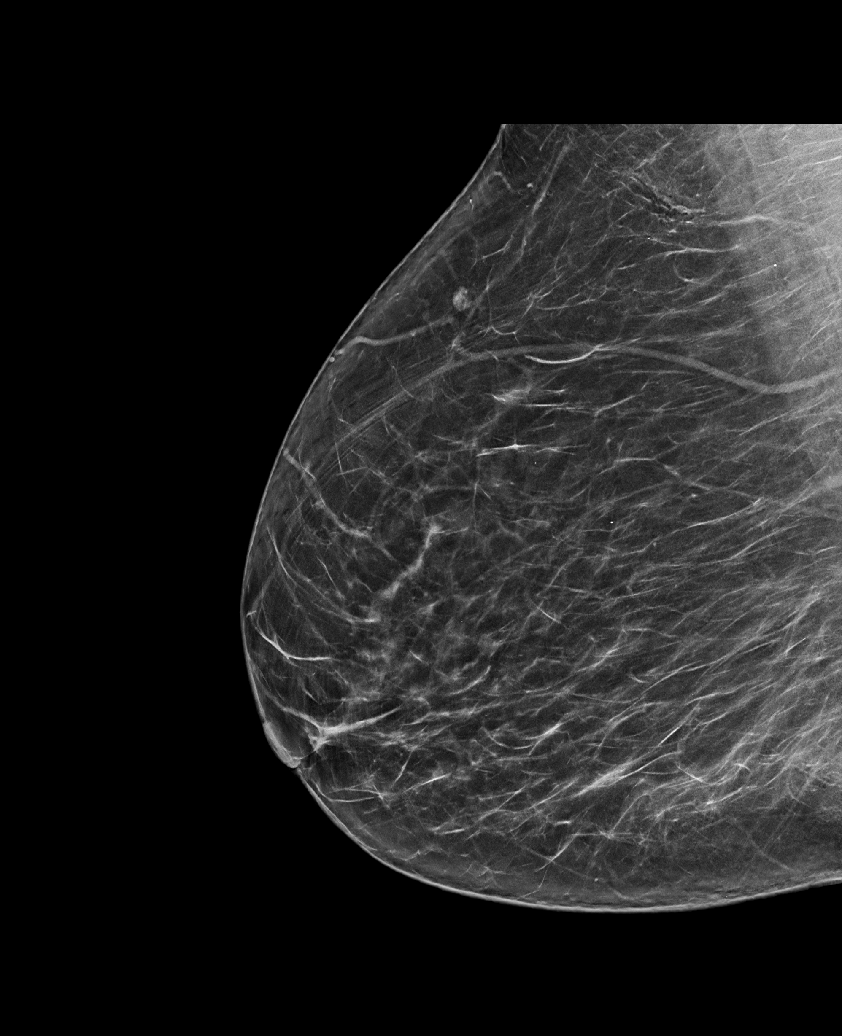

[L CC synth-2D]
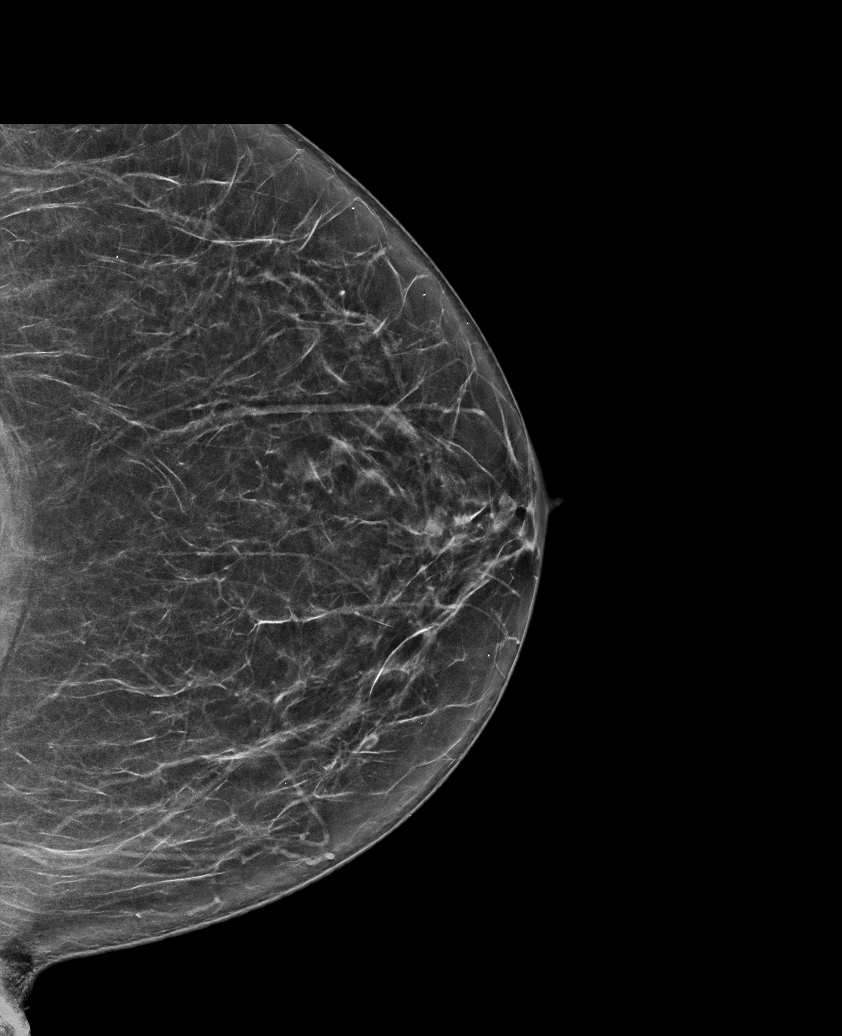

[L MLO tomo · tomo slice 38/75.0]
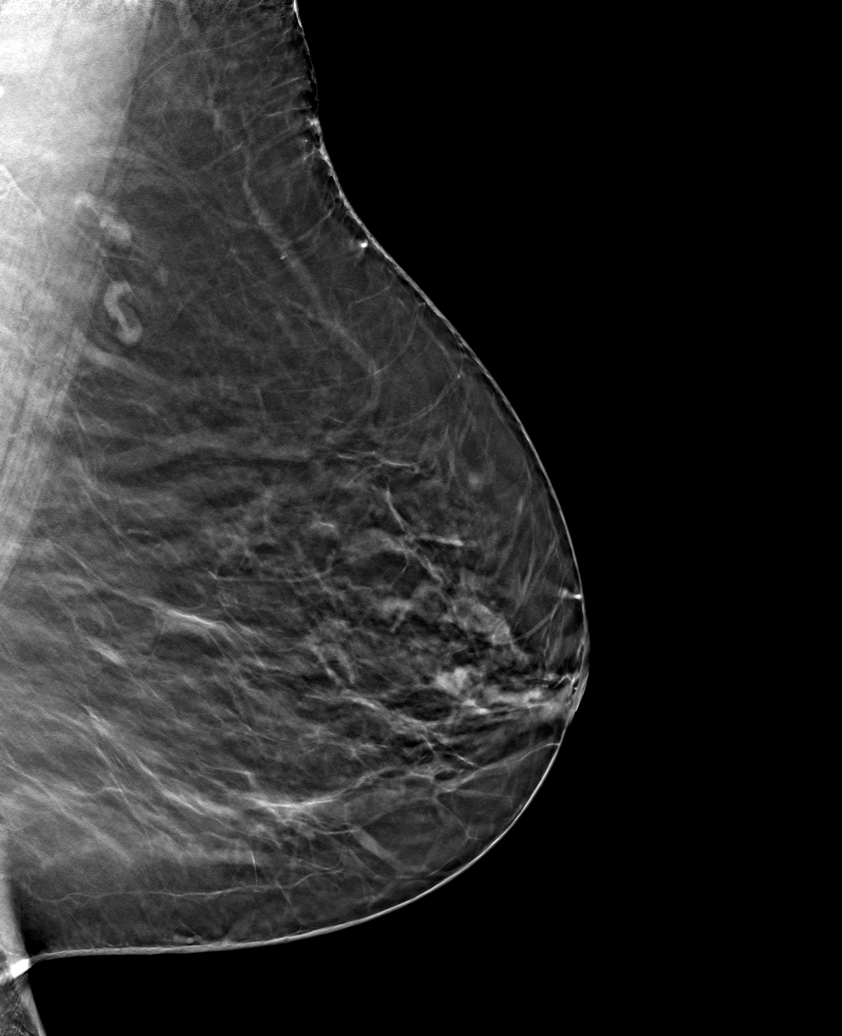

[6 of 30 positions shown; findings below may reference images not displayed]

ACR Breast Density Category b: There are scattered areas of
fibroglandular density.
FINDINGS: There are no findings suspicious for malignancy.
IMPRESSION: No mammographic evidence of malignancy. A result letter of this
screening mammogram will be mailed directly to the patient.

RECOMMENDATION:
Screening mammogram in one year. (Code:51-O-LD2)

BI-RADS CATEGORY  1: Negative.

## 2024-03-25 ENCOUNTER — Other Ambulatory Visit: Payer: Self-pay | Admitting: Family Medicine

## 2024-03-25 DIAGNOSIS — Z1231 Encounter for screening mammogram for malignant neoplasm of breast: Secondary | ICD-10-CM

## 2024-04-21 ENCOUNTER — Encounter

## 2024-06-23 ENCOUNTER — Encounter: Admitting: Internal Medicine

## 2024-07-02 ENCOUNTER — Ambulatory Visit: Admitting: Physician Assistant
